# Patient Record
Sex: Female | Born: 1955 | Race: White | Hispanic: No | Marital: Married | Smoking: Never smoker
Health system: Southern US, Community
[De-identification: ages and names within clinical notes are randomized; demographics above are authoritative.]

## PROBLEM LIST (undated history)

## (undated) DIAGNOSIS — E0590 Thyrotoxicosis, unspecified without thyrotoxic crisis or storm: Secondary | ICD-10-CM

## (undated) DIAGNOSIS — K2100 Gastro-esophageal reflux disease with esophagitis, without bleeding: Secondary | ICD-10-CM

## (undated) DIAGNOSIS — K3189 Other diseases of stomach and duodenum: Secondary | ICD-10-CM

## (undated) DIAGNOSIS — E785 Hyperlipidemia, unspecified: Secondary | ICD-10-CM

## (undated) DIAGNOSIS — R51 Headache: Secondary | ICD-10-CM

## (undated) DIAGNOSIS — R519 Headache, unspecified: Secondary | ICD-10-CM

## (undated) DIAGNOSIS — K602 Anal fissure, unspecified: Secondary | ICD-10-CM

## (undated) DIAGNOSIS — Z86010 Personal history of colonic polyps: Principal | ICD-10-CM

## (undated) DIAGNOSIS — M8580 Other specified disorders of bone density and structure, unspecified site: Secondary | ICD-10-CM

## (undated) DIAGNOSIS — E669 Obesity, unspecified: Secondary | ICD-10-CM

## (undated) DIAGNOSIS — G8929 Other chronic pain: Secondary | ICD-10-CM

## (undated) DIAGNOSIS — K219 Gastro-esophageal reflux disease without esophagitis: Secondary | ICD-10-CM

## (undated) HISTORY — DX: Gastro-esophageal reflux disease with esophagitis: K21.0

## (undated) HISTORY — DX: Personal history of colonic polyps: Z86.010

## (undated) HISTORY — PX: ESOPHAGOGASTRODUODENOSCOPY: SHX1529

## (undated) HISTORY — DX: Gastro-esophageal reflux disease with esophagitis, without bleeding: K21.00

## (undated) HISTORY — DX: Other chronic pain: G89.29

## (undated) HISTORY — DX: Other diseases of stomach and duodenum: K31.89

## (undated) HISTORY — DX: Gastro-esophageal reflux disease without esophagitis: K21.9

## (undated) HISTORY — DX: Headache, unspecified: R51.9

## (undated) HISTORY — PX: COLONOSCOPY WITH PROPOFOL: SHX5780

## (undated) HISTORY — DX: Anal fissure, unspecified: K60.2

## (undated) HISTORY — PX: APPENDECTOMY: SHX54

## (undated) HISTORY — DX: Obesity, unspecified: E66.9

## (undated) HISTORY — DX: Other specified disorders of bone density and structure, unspecified site: M85.80

## (undated) HISTORY — DX: Headache: R51

## (undated) HISTORY — DX: Hyperlipidemia, unspecified: E78.5

## (undated) HISTORY — DX: Thyrotoxicosis, unspecified without thyrotoxic crisis or storm: E05.90

---

## 1991-02-27 HISTORY — PX: HEMORRHOID SURGERY: SHX153

## 1997-07-15 ENCOUNTER — Other Ambulatory Visit: Admission: RE | Admit: 1997-07-15 | Discharge: 1997-07-15 | Payer: Self-pay | Admitting: *Deleted

## 1998-06-13 ENCOUNTER — Other Ambulatory Visit: Admission: RE | Admit: 1998-06-13 | Discharge: 1998-06-13 | Payer: Self-pay | Admitting: Family Medicine

## 2000-02-22 ENCOUNTER — Other Ambulatory Visit: Admission: RE | Admit: 2000-02-22 | Discharge: 2000-02-22 | Payer: Self-pay | Admitting: Internal Medicine

## 2000-03-28 ENCOUNTER — Other Ambulatory Visit: Admission: RE | Admit: 2000-03-28 | Discharge: 2000-03-28 | Payer: Self-pay | Admitting: Obstetrics and Gynecology

## 2000-07-24 ENCOUNTER — Encounter: Admission: RE | Admit: 2000-07-24 | Discharge: 2000-07-24 | Payer: Self-pay | Admitting: *Deleted

## 2000-07-24 ENCOUNTER — Encounter: Payer: Self-pay | Admitting: Allergy and Immunology

## 2001-03-18 ENCOUNTER — Other Ambulatory Visit: Admission: RE | Admit: 2001-03-18 | Discharge: 2001-03-18 | Payer: Self-pay | Admitting: Obstetrics and Gynecology

## 2002-03-20 ENCOUNTER — Other Ambulatory Visit: Admission: RE | Admit: 2002-03-20 | Discharge: 2002-03-20 | Payer: Self-pay | Admitting: Obstetrics and Gynecology

## 2002-07-16 ENCOUNTER — Encounter: Admission: RE | Admit: 2002-07-16 | Discharge: 2002-07-16 | Payer: Self-pay | Admitting: Family Medicine

## 2002-07-16 ENCOUNTER — Encounter: Payer: Self-pay | Admitting: Family Medicine

## 2003-04-14 ENCOUNTER — Other Ambulatory Visit: Admission: RE | Admit: 2003-04-14 | Discharge: 2003-04-14 | Payer: Self-pay | Admitting: Obstetrics and Gynecology

## 2003-07-02 ENCOUNTER — Encounter: Admission: RE | Admit: 2003-07-02 | Discharge: 2003-07-02 | Payer: Self-pay | Admitting: Internal Medicine

## 2003-11-12 ENCOUNTER — Ambulatory Visit (HOSPITAL_COMMUNITY): Admission: RE | Admit: 2003-11-12 | Discharge: 2003-11-12 | Payer: Self-pay | Admitting: Internal Medicine

## 2004-06-19 ENCOUNTER — Encounter: Admission: RE | Admit: 2004-06-19 | Discharge: 2004-06-19 | Payer: Self-pay | Admitting: Internal Medicine

## 2004-06-23 ENCOUNTER — Encounter: Admission: RE | Admit: 2004-06-23 | Discharge: 2004-06-23 | Payer: Self-pay | Admitting: Internal Medicine

## 2004-11-20 ENCOUNTER — Encounter: Admission: RE | Admit: 2004-11-20 | Discharge: 2004-11-20 | Payer: Self-pay | Admitting: Internal Medicine

## 2004-11-28 ENCOUNTER — Encounter (HOSPITAL_COMMUNITY): Admission: RE | Admit: 2004-11-28 | Discharge: 2005-02-26 | Payer: Self-pay | Admitting: Internal Medicine

## 2005-01-29 ENCOUNTER — Encounter (INDEPENDENT_AMBULATORY_CARE_PROVIDER_SITE_OTHER): Payer: Self-pay | Admitting: *Deleted

## 2005-01-29 ENCOUNTER — Encounter: Admission: RE | Admit: 2005-01-29 | Discharge: 2005-01-29 | Payer: Self-pay | Admitting: Endocrinology

## 2005-01-29 ENCOUNTER — Other Ambulatory Visit: Admission: RE | Admit: 2005-01-29 | Discharge: 2005-01-29 | Payer: Self-pay | Admitting: Interventional Radiology

## 2005-02-13 ENCOUNTER — Encounter: Admission: RE | Admit: 2005-02-13 | Discharge: 2005-02-13 | Payer: Self-pay | Admitting: Internal Medicine

## 2006-01-04 ENCOUNTER — Encounter: Admission: RE | Admit: 2006-01-04 | Discharge: 2006-01-04 | Payer: Self-pay | Admitting: Gastroenterology

## 2006-07-05 ENCOUNTER — Other Ambulatory Visit: Admission: RE | Admit: 2006-07-05 | Discharge: 2006-07-05 | Payer: Self-pay | Admitting: *Deleted

## 2006-07-17 ENCOUNTER — Encounter: Admission: RE | Admit: 2006-07-17 | Discharge: 2006-07-17 | Payer: Self-pay | Admitting: *Deleted

## 2006-08-16 ENCOUNTER — Encounter (INDEPENDENT_AMBULATORY_CARE_PROVIDER_SITE_OTHER): Payer: Self-pay | Admitting: Obstetrics and Gynecology

## 2006-08-16 ENCOUNTER — Ambulatory Visit (HOSPITAL_COMMUNITY): Admission: RE | Admit: 2006-08-16 | Discharge: 2006-08-16 | Payer: Self-pay | Admitting: Obstetrics and Gynecology

## 2007-07-15 ENCOUNTER — Other Ambulatory Visit: Admission: RE | Admit: 2007-07-15 | Discharge: 2007-07-15 | Payer: Self-pay | Admitting: Family Medicine

## 2009-08-23 ENCOUNTER — Other Ambulatory Visit: Admission: RE | Admit: 2009-08-23 | Discharge: 2009-08-23 | Payer: Self-pay | Admitting: Radiology

## 2010-02-21 ENCOUNTER — Encounter
Admission: RE | Admit: 2010-02-21 | Discharge: 2010-02-21 | Payer: Self-pay | Source: Home / Self Care | Attending: Otolaryngology | Admitting: Otolaryngology

## 2010-03-18 ENCOUNTER — Encounter: Payer: Self-pay | Admitting: Endocrinology

## 2010-03-19 ENCOUNTER — Encounter: Payer: Self-pay | Admitting: Internal Medicine

## 2010-03-31 ENCOUNTER — Other Ambulatory Visit: Payer: Self-pay | Admitting: Dermatology

## 2010-07-11 NOTE — Op Note (Signed)
NAMEBRENLEY, Whitney Gonzales                ACCOUNT NO.:  [id number]   MEDICAL RECORD NO.:  [id number]          PATIENT TYPE:  AMB   LOCATION:  SDC                           FACILITY:  [facility]   PHYSICIAN:  Charles A. Delcambre, MDDATE OF BIRTH:  [birth year]   DATE OF PROCEDURE:  08/16/2006  DATE OF DISCHARGE:                               OPERATIVE REPORT   PREOPERATIVE DIAGNOSES:  1. Irregular bleeding.  2. Endometrial polyp.   POSTOPERATIVE DIAGNOSES:  1. Irregular bleeding.  2. Endometrial polyp.   PROCEDURE:  1. Hysteroscopy.  2. Dilation and curettage.  3. Polypectomy.  4. Paracervical block.   SURGEON:  Delcambre.   ASSISTANT:  None.   COMPLICATIONS:  None.   ESTIMATED BLOOD LOSS:  Less than 20 mL.   SPECIMENS:  1. Polyp.  2. Endometrial curettings to pathology.   ANESTHESIA:  General by the endotracheal route.   FINDINGS:  Small endometrial polyp on the anterior wall curetted off  with endometrial curettings.  Posterior wall was cystic appearing  grossly.  Polyp removed with polyp forceps.  Instrument, sponge, needle  count correct x2.   DESCRIPTION OF PROCEDURE:  The patient was taken to the operating room  and placed in supine position.  General anesthetic was induced without  difficulty.  She was then placed in the dorsal lithotomy position in  universal stirrups.  A sterile prep and drape was undertaken, a weighted  speculum was placed in vagina, tenaculum was placed on speculum,  paracervical block with 20 mL 0.25% plain Marcaine was divided equally  at 4 and 8 o'clock.  There was no evidence of intravascular location of  injection.  Hanks dilators were used to dilate enough to pass a small  hysteroscope.  A hysteroscope was passed.  Hysteroscopic findings were  noted.  The polyp forceps were used to remove the lower polyp.  Endometrial curettings were then undertaken circumferentially,  emphasizing the 2 o'clock area where the small sessile polyp had been  seen.   These are sent separately.  There was no evidence of perforation.  A camera was placed once again and all polyps had been removed.  There  was no active bleeding.  Tenaculum was  removed, no active bleeding.  The patient had labia that was stitched  up, and this was removed for visualization; the stitch was removed,  which was done during the case for visualization of the cervix.  Sorbitol loss was 0 during the case.      Charles A. Sydnee Cabal, MD  Electronically Signed     CAD/MEDQ  D:  08/16/2006  T:  08/16/2006  Job:  [id number]

## 2010-09-05 ENCOUNTER — Other Ambulatory Visit (HOSPITAL_COMMUNITY)
Admission: RE | Admit: 2010-09-05 | Discharge: 2010-09-05 | Disposition: A | Discharge status: Home / Self Care | Payer: BC Managed Care – PPO | Source: Ambulatory Visit | Attending: Family Medicine | Admitting: Family Medicine

## 2010-09-05 ENCOUNTER — Other Ambulatory Visit: Payer: Self-pay | Admitting: Physician Assistant

## 2010-09-05 DIAGNOSIS — Z01419 Encounter for gynecological examination (general) (routine) without abnormal findings: Secondary | ICD-10-CM | POA: Insufficient documentation

## 2010-12-13 LAB — CBC
HCT: 41.6
Hemoglobin: 14.2
MCHC: 34.1
MCV: 92.2
Platelets: 189
RBC: 4.51
RDW: 12.2
WBC: 5

## 2010-12-13 LAB — PREGNANCY, URINE: Preg Test, Ur: NEGATIVE

## 2012-04-23 ENCOUNTER — Other Ambulatory Visit: Payer: Self-pay | Admitting: Dermatology

## 2012-08-30 ENCOUNTER — Emergency Department (HOSPITAL_BASED_OUTPATIENT_CLINIC_OR_DEPARTMENT_OTHER)
Admission: EM | Admit: 2012-08-30 | Discharge: 2012-08-31 | Disposition: A | Discharge status: Home / Self Care | Payer: BC Managed Care – PPO | Attending: Emergency Medicine | Admitting: Emergency Medicine

## 2012-08-30 ENCOUNTER — Other Ambulatory Visit: Payer: Self-pay

## 2012-08-30 ENCOUNTER — Encounter (HOSPITAL_BASED_OUTPATIENT_CLINIC_OR_DEPARTMENT_OTHER): Payer: Self-pay | Admitting: *Deleted

## 2012-08-30 DIAGNOSIS — K219 Gastro-esophageal reflux disease without esophagitis: Secondary | ICD-10-CM

## 2012-08-30 DIAGNOSIS — R1013 Epigastric pain: Secondary | ICD-10-CM | POA: Insufficient documentation

## 2012-08-30 NOTE — ED Notes (Addendum)
Pt states she was walking down the steps this afternoon and developed some chest tightness that wouldn't go away. Had some SHOB and fatigue while shopping with daughter. "had to sit down" Also states she has been under a lot of stress for about a year "deadline coming up" EKG done at triage. Also states she has been bruising easily lately.

## 2012-08-31 ENCOUNTER — Emergency Department (HOSPITAL_BASED_OUTPATIENT_CLINIC_OR_DEPARTMENT_OTHER): Payer: BC Managed Care – PPO

## 2012-08-31 LAB — CBC WITH DIFFERENTIAL/PLATELET
Basophils Absolute: 0 10*3/uL (ref 0.0–0.1)
Basophils Relative: 0 % (ref 0–1)
Eosinophils Absolute: 0.1 10*3/uL (ref 0.0–0.7)
Eosinophils Relative: 2 % (ref 0–5)
HCT: 39.9 % (ref 36.0–46.0)
Hemoglobin: 14.1 g/dL (ref 12.0–15.0)
Lymphocytes Relative: 35 % (ref 12–46)
Lymphs Abs: 1.7 10*3/uL (ref 0.7–4.0)
MCH: 31.8 pg (ref 26.0–34.0)
MCHC: 35.3 g/dL (ref 30.0–36.0)
MCV: 89.9 fL (ref 78.0–100.0)
Monocytes Absolute: 0.6 10*3/uL (ref 0.1–1.0)
Monocytes Relative: 13 % — ABNORMAL HIGH (ref 3–12)
Neutro Abs: 2.5 10*3/uL (ref 1.7–7.7)
Neutrophils Relative %: 51 % (ref 43–77)
Platelets: 212 10*3/uL (ref 150–400)
RBC: 4.44 MIL/uL (ref 3.87–5.11)
RDW: 11.9 % (ref 11.5–15.5)
WBC: 4.8 10*3/uL (ref 4.0–10.5)

## 2012-08-31 LAB — COMPREHENSIVE METABOLIC PANEL
ALT: 14 U/L (ref 0–35)
AST: 21 U/L (ref 0–37)
Albumin: 3.9 g/dL (ref 3.5–5.2)
Alkaline Phosphatase: 78 U/L (ref 39–117)
BUN: 24 mg/dL — ABNORMAL HIGH (ref 6–23)
CO2: 29 mEq/L (ref 19–32)
Calcium: 9.4 mg/dL (ref 8.4–10.5)
Chloride: 102 mEq/L (ref 96–112)
Creatinine, Ser: 0.8 mg/dL (ref 0.50–1.10)
GFR calc Af Amer: >90 mL/min (ref >90)
GFR calc non Af Amer: 80 mL/min — ABNORMAL LOW (ref >90)
Glucose, Bld: 142 mg/dL — ABNORMAL HIGH (ref 70–99)
Potassium: 3.5 mEq/L (ref 3.5–5.1)
Sodium: 140 mEq/L (ref 135–145)
Total Bilirubin: 0.3 mg/dL (ref 0.3–1.2)
Total Protein: 6.9 g/dL (ref 6.0–8.3)

## 2012-08-31 LAB — LIPASE, BLOOD: Lipase: 44 U/L (ref 11–59)

## 2012-08-31 LAB — TROPONIN I
Troponin I: <0.3 ng/mL (ref <0.30)
Troponin I: <0.3 ng/mL (ref <0.30)

## 2012-08-31 MED ORDER — GI COCKTAIL ~~LOC~~
30.0000 mL | Freq: Once | ORAL | Status: AC
  Administered 2012-08-31: 30 mL via ORAL
  Filled 2012-08-31: qty 30

## 2012-08-31 MED ORDER — SUCRALFATE 1 GM/10ML PO SUSP: 1.0000 g | Freq: Four times a day (QID) | ORAL | Status: DC

## 2012-08-31 MED ORDER — OMEPRAZOLE 20 MG PO CPDR: 20.0000 mg | DELAYED_RELEASE_CAPSULE | Freq: Every day | ORAL | Status: DC

## 2012-08-31 NOTE — ED Notes (Signed)
MD at bedside. 

## 2012-08-31 NOTE — ED Provider Notes (Signed)
History  This chart was scribed for Marc Leichter Smitty Cords, MD by Greggory Stallion, ED Scribe. This patient was seen in room MH06/MH06 and the patient's care was started at 11:58 PM.  CSN: [id number] Arrival date & time 08/30/12  2318   Chief Complaint  Patient presents with  . Chest Pain   Patient is a 57 y.o. female presenting with chest pain. The history is provided by the patient. No language interpreter was used.  Chest Pain Pain location:  Epigastric Pain quality: tightness   Pain radiates to:  Does not radiate Pain radiates to the back: no   Pain severity:  Mild Onset quality:  Gradual Duration:  3 hours Timing:  Constant Progression:  Unchanged Chronicity:  New Context: eating and stress   Relieved by:  Nothing Worsened by:  Movement Ineffective treatments:  None tried Associated symptoms: no abdominal pain, no back pain, no claudication, no cough, no diaphoresis, no fever, no headache, no heartburn, no nausea, no near-syncope, no palpitations, no shortness of breath, no syncope, not vomiting and no weakness   Risk factors: no prior DVT/PE and no smoking     HPI Comments: AVANELLE PIXLEY is a 57 y.o. female who presents to the Emergency Department complaining of epigastric tightness that started when she was walking downstairs earlier tonight. . She states she was under stress related to a work project and had had a burrito earlier in the day and fried rice just prior to incident starting.  Pt denies wheezing. Pt states she has not taken any long car trips lately. LNMP was May 2013.  History reviewed. No pertinent past medical history. Past Surgical History  Procedure Laterality Date  . Appendectomy    . Hemorrhoid surgery     History reviewed. No pertinent family history. History  Substance Use Topics  . Smoking status: Never Smoker   . Smokeless tobacco: Not on file  . Alcohol Use: No   OB History   Grav Para Term Preterm Abortions TAB SAB Ect Mult Living                  Review of Systems  Constitutional: Negative for fever and diaphoresis.  Respiratory: Negative for cough, shortness of breath and wheezing.   Cardiovascular: Positive for chest pain. Negative for palpitations, claudication, leg swelling, syncope and near-syncope.  Gastrointestinal: Negative for heartburn, nausea, vomiting and abdominal pain.  Musculoskeletal: Negative for back pain.  Neurological: Negative for weakness and headaches.  All other systems reviewed and are negative.    Allergies  Aleve  Home Medications   Current Outpatient Rx  Name  Route  Sig  Dispense  Refill  . zolpidem (AMBIEN CR) 6.25 MG CR tablet   Oral   Take 6.25 mg by mouth at bedtime as needed for sleep.          BP 137/71  Pulse 68  Temp(Src) 98.1 F (36.7 C) (Oral)  Resp 20  Ht 5\' 6"  (1.676 m)  Wt 155 lb (70.308 kg)  BMI 25.03 kg/m2  SpO2 100%  Physical Exam  Nursing note and vitals reviewed. Constitutional: She is oriented to person, place, and time. She appears well-developed and well-nourished.  Non-toxic appearance. No distress.  HENT:  Head: Normocephalic and atraumatic.  Mouth/Throat: Oropharynx is clear and moist.  Moist mucous membranes, no exudates.   Eyes: Conjunctivae, EOM and lids are normal. Pupils are equal, round, and reactive to light.  Neck: Normal range of motion. Neck supple. No tracheal deviation present.  No mass present.  Cardiovascular: Normal rate, regular rhythm and normal heart sounds.  Exam reveals no gallop.   No murmur heard. Pulmonary/Chest: Effort normal and breath sounds normal. No stridor. No respiratory distress. She has no decreased breath sounds. She has no wheezes. She has no rhonchi. She has no rales.  Abdominal: Soft. Normal appearance. She exhibits no distension. Bowel sounds are increased. There is no tenderness. There is no rebound, no guarding, no CVA tenderness, no tenderness at McBurney's point and negative Murphy's sign.  A lot of gas in  epigastrium and LUQ.   Musculoskeletal: Normal range of motion. She exhibits no edema and no tenderness.  Neurological: She is alert and oriented to person, place, and time. She has normal strength. No cranial nerve deficit or sensory deficit. GCS eye subscore is 4. GCS verbal subscore is 5. GCS motor subscore is 6.  Skin: Skin is warm and dry. No abrasion and no rash noted.  Psychiatric: She has a normal mood and affect. Her speech is normal and behavior is normal.    ED Course  Procedures (including critical care time)  DIAGNOSTIC STUDIES: Oxygen Saturation is 100% on RA, normal by my interpretation.    COORDINATION OF CARE: 12:26 AM-Discussed treatment plan which includes chest xray with pt at bedside and pt agreed to plan.   Labs Reviewed - No data to display No results found. No diagnosis found.  MDM   Date: 08/31/2012  Rate: 67  Rhythm: normal sinus rhythm  QRS Axis: normal  Intervals: normal  ST/T Wave abnormalities: normal  Conduction Disutrbances: none  Narrative Interpretation: unremarkable  GERD brought on be stress and diet.  Symptoms relieved with GI cocktail and then returned as bitter acidic taste in throat and increased gas.  Doubt cardiac etiology.  Negative ekg and troponins follow up with your family doctor for ongoing care     I personally performed the services described in this documentation, which was scribed in my presence. The recorded information has been reviewed and is accurate.    Jasmine Awe, MD 08/31/12 [phone]

## 2012-08-31 NOTE — ED Notes (Signed)
Patient transported to X-ray via stretcher per tech. 

## 2013-04-29 ENCOUNTER — Other Ambulatory Visit: Payer: Self-pay | Admitting: Dermatology

## 2013-08-31 ENCOUNTER — Ambulatory Visit
Admission: RE | Admit: 2013-08-31 | Discharge: 2013-08-31 | Disposition: A | Discharge status: Home / Self Care | Payer: BC Managed Care – PPO | Source: Ambulatory Visit | Attending: Family | Admitting: Family

## 2013-08-31 ENCOUNTER — Other Ambulatory Visit: Payer: Self-pay | Admitting: Family

## 2013-08-31 DIAGNOSIS — G4482 Headache associated with sexual activity: Secondary | ICD-10-CM

## 2013-09-01 ENCOUNTER — Other Ambulatory Visit: Payer: Self-pay | Admitting: Family

## 2013-09-01 DIAGNOSIS — G4482 Headache associated with sexual activity: Secondary | ICD-10-CM

## 2013-09-04 ENCOUNTER — Encounter (INDEPENDENT_AMBULATORY_CARE_PROVIDER_SITE_OTHER): Payer: Self-pay

## 2013-09-04 ENCOUNTER — Ambulatory Visit
Admission: RE | Admit: 2013-09-04 | Discharge: 2013-09-04 | Disposition: A | Discharge status: Home / Self Care | Payer: BC Managed Care – PPO | Source: Ambulatory Visit | Attending: Family | Admitting: Family

## 2013-09-04 DIAGNOSIS — G4482 Headache associated with sexual activity: Secondary | ICD-10-CM

## 2013-09-04 MED ORDER — IOHEXOL 350 MG/ML SOLN
80.0000 mL | Freq: Once | INTRAVENOUS | Status: AC | PRN
  Administered 2013-09-04: 80 mL via INTRAVENOUS

## 2013-12-11 ENCOUNTER — Other Ambulatory Visit: Payer: Self-pay

## 2014-04-07 ENCOUNTER — Other Ambulatory Visit (HOSPITAL_COMMUNITY)
Admission: RE | Admit: 2014-04-07 | Discharge: 2014-04-07 | Disposition: A | Discharge status: Home / Self Care | Payer: 59 | Source: Ambulatory Visit | Attending: Family Medicine | Admitting: Family Medicine

## 2014-04-07 ENCOUNTER — Other Ambulatory Visit: Payer: Self-pay

## 2014-04-07 DIAGNOSIS — Z01419 Encounter for gynecological examination (general) (routine) without abnormal findings: Secondary | ICD-10-CM | POA: Insufficient documentation

## 2014-04-09 LAB — CYTOLOGY - PAP

## 2015-06-22 ENCOUNTER — Encounter: Payer: Self-pay | Admitting: Gastroenterology

## 2015-06-22 ENCOUNTER — Ambulatory Visit (INDEPENDENT_AMBULATORY_CARE_PROVIDER_SITE_OTHER): Payer: 59 | Admitting: Gastroenterology

## 2015-06-22 VITALS — BP 112/64 | HR 76 | Ht 65.25 in | Wt 175.1 lb

## 2015-06-22 DIAGNOSIS — K31A0 Gastric intestinal metaplasia, unspecified: Secondary | ICD-10-CM | POA: Insufficient documentation

## 2015-06-22 DIAGNOSIS — Z1211 Encounter for screening for malignant neoplasm of colon: Secondary | ICD-10-CM

## 2015-06-22 DIAGNOSIS — K219 Gastro-esophageal reflux disease without esophagitis: Secondary | ICD-10-CM

## 2015-06-22 DIAGNOSIS — J029 Acute pharyngitis, unspecified: Secondary | ICD-10-CM

## 2015-06-22 DIAGNOSIS — K3189 Other diseases of stomach and duodenum: Secondary | ICD-10-CM | POA: Insufficient documentation

## 2015-06-22 HISTORY — DX: Gastric intestinal metaplasia, unspecified: K31.A0

## 2015-06-22 NOTE — Patient Instructions (Signed)

## 2015-06-22 NOTE — Progress Notes (Signed)
06/22/2015 Whitney Gonzales [id number] 1955-03-11   HISTORY OF PRESENT ILLNESS:  This is a pleasant 60 year old female who is new to our practice.  She is here today to discuss colonoscopy. She tells me that her last colonoscopy was 10 years ago by Dr. Earlean Shawl at which time the study was normal and it was recommended that she have a repeat procedure in 10 years from that time. While she is here she also mentions that she has a constant degree of sore throat. She does have intermittent acid reflux, maybe 1 episode per week, but does not take anything for those issues. She is unsure if the sore throat is related to the reflux or if it is something totally separate. She reports having an endoscopy the same time as her last colonoscopy as well; says that she had a small hiatal hernia. She is reluctant to take any reflux medication, particularly on a regular basis.  **Requesting Dr. Carlean Purl.   Past Medical History  Diagnosis Date  . Anal fissure   . Chronic headaches   . HLD (hyperlipidemia)   . Hyperthyroidism    Past Surgical History  Procedure Laterality Date  . Appendectomy    . Hemorrhoid surgery  1993    reports that she has never smoked. She has never used smokeless tobacco. She reports that she drinks alcohol. She reports that she does not use illicit drugs. family history includes Cancer in her mother; Diabetes in her maternal grandfather, maternal grandmother, paternal grandfather, and paternal grandmother; Heart disease in her maternal grandfather; Liver cancer in her paternal aunt. Allergies  Allergen Reactions  . Aleve [Naproxen Sodium] Itching      Outpatient Encounter Prescriptions as of 06/22/2015  Medication Sig  . ergocalciferol (VITAMIN D2) 50000 units capsule Take 50,000 Units by mouth once a week.  . zolpidem (AMBIEN CR) 6.25 MG CR tablet Take 6.25 mg by mouth at bedtime as needed for sleep.  . [DISCONTINUED] omeprazole (PRILOSEC) 20 MG capsule Take 1 capsule (20 mg  total) by mouth daily.  . [DISCONTINUED] sucralfate (CARAFATE) 1 GM/10ML suspension Take 10 mLs (1 g total) by mouth 4 (four) times daily.   No facility-administered encounter medications on file as of 06/22/2015.     REVIEW OF SYSTEMS  : All other systems reviewed and negative except where noted in the History of Present Illness.   PHYSICAL EXAM: BP 112/64 mmHg  Pulse 76  Ht 5' 5.25" (1.657 m)  Wt 175 lb 2 oz (79.436 kg)  BMI 28.93 kg/m2 General: Well developed white female in no acute distress Head: Normocephalic and atraumatic Eyes:  Sclerae anicteric, conjunctiva pink. Ears: Normal auditory acuity Lungs: Clear throughout to auscultation Heart: Regular rate and rhythm Abdomen: Soft, non-distended.  Normal bowel sounds.  Non-tender. Rectal:  Will be done at the time of colonoscopy. Musculoskeletal: Symmetrical with no gross deformities  Skin: No lesions on visible extremities Extremities: No edema  Neurological: Alert oriented x 4, grossly non-focal Psychological:  Alert and cooperative. Normal mood and affect  ASSESSMENT AND PLAN: -Screening colonoscopy:  Her last was 10 years ago with Dr. Earlean Shawl.  Will schedule with Dr. Carlean Purl (patient request; she is friends with Margaretmary Eddy on the 4th floor). -Sore throat:  She does have some heartburn/reflux maybe once a week or so.  Does not take any medication and is reluctant to do so especially on regular basis. I am unsure if her sore throat is related to reflux or not. Will assess with  EGD, but explained her need to possibly take daily medication even if it is just an H2 blocker if she has evidence of severe reflux versus possibly seeing ENT physician for her sore throat if endoscopy is unremarkable.  *The risks, benefits, and alternatives to EGD and colonoscopy were discussed with the patient and she consents to proceed.       CC:  No ref. provider found

## 2015-07-07 ENCOUNTER — Encounter: Payer: Self-pay | Admitting: Internal Medicine

## 2015-07-07 ENCOUNTER — Ambulatory Visit (AMBULATORY_SURGERY_CENTER): Payer: 59 | Admitting: Internal Medicine

## 2015-07-07 VITALS — BP 130/68 | HR 65 | Temp 99.1°F | Resp 15 | Ht 65.25 in | Wt 175.0 lb

## 2015-07-07 DIAGNOSIS — K317 Polyp of stomach and duodenum: Secondary | ICD-10-CM | POA: Diagnosis not present

## 2015-07-07 DIAGNOSIS — K2100 Gastro-esophageal reflux disease with esophagitis, without bleeding: Secondary | ICD-10-CM

## 2015-07-07 DIAGNOSIS — D122 Benign neoplasm of ascending colon: Secondary | ICD-10-CM

## 2015-07-07 DIAGNOSIS — K208 Other esophagitis: Secondary | ICD-10-CM | POA: Diagnosis not present

## 2015-07-07 DIAGNOSIS — Z1211 Encounter for screening for malignant neoplasm of colon: Secondary | ICD-10-CM | POA: Diagnosis not present

## 2015-07-07 MED ORDER — PANTOPRAZOLE SODIUM 40 MG PO TBEC: 40.0000 mg | DELAYED_RELEASE_TABLET | Freq: Every day | ORAL | Status: DC

## 2015-07-07 MED ORDER — SODIUM CHLORIDE 0.9 % IV SOLN: 500.0000 mL | INTRAVENOUS | Status: DC

## 2015-07-07 NOTE — Progress Notes (Signed)
Called to room to assist during endoscopic procedure.  Patient ID and intended procedure confirmed with present staff. Received instructions for my participation in the procedure from the performing physician.  

## 2015-07-07 NOTE — Progress Notes (Signed)
A and Ox 3 Report to RN 

## 2015-07-07 NOTE — Op Note (Addendum)
[facility] Patient Name: Whitney Gonzales Procedure Date: 07/07/2015 1:50 PM MRN: [id number] Endoscopist: Gatha Mayer , MD Age: 60 Date of Birth: [birth year] Gender: Female Procedure:                Upper GI endoscopy Indications:              Suspected esophageal reflux, Sore throat Medicines:                Propofol per Anesthesia, Monitored Anesthesia Care Procedure:                Pre-Anesthesia Assessment:                           - Prior to the procedure, a History and Physical                            was performed, and patient medications and                            allergies were reviewed. The patient's tolerance of                            previous anesthesia was also reviewed. The risks                            and benefits of the procedure and the sedation                            options and risks were discussed with the patient.                            All questions were answered, and informed consent                            was obtained. Prior Anticoagulants: The patient has                            taken no previous anticoagulant or antiplatelet                            agents. ASA Grade Assessment: II - A patient with                            mild systemic disease. After reviewing the risks                            and benefits, the patient was deemed in                            satisfactory condition to undergo the procedure.                           After obtaining informed consent, the endoscope was  passed under direct vision. Throughout the                            procedure, the patient's blood pressure, pulse, and                            oxygen saturations were monitored continuously. The                            Model GIF-[id number]) scope was introduced                            through the mouth, and advanced to the second part                            of duodenum. The upper GI  endoscopy was                            accomplished without difficulty. The patient                            tolerated the procedure well. Scope In: Scope Out: Findings:                 The hypopharynx was normal.                           The larynx was normal. Limited views so lesions                            could have been missed                           LA Grade A (one or more mucosal breaks less than 5                            mm, not extending between tops of 2 mucosal folds)                            esophagitis with no bleeding was found at the                            gastroesophageal junction. Biopsies were taken with                            a cold forceps for histology. Verification of                            patient identification for the specimen was done.                            Estimated blood loss was minimal.                           LA Grade A (  one or more mucosal breaks less than 5                            mm, not extending between tops of 2 mucosal folds)                            esophagitis with no bleeding was found in the                            middle third of the esophagus. Biopsies were taken                            with a cold forceps for histology. Verification of                            patient identification for the specimen was done.                            Estimated blood loss was minimal.                           A small hiatal hernia was present.                           A single 6 mm sessile polyp with no bleeding and no                            stigmata of recent bleeding was found on the                            greater curvature of the gastric body. The polyp                            was removed with a cold biopsy forceps. Resection                            and retrieval were complete. Verification of                            patient identification for the specimen was done.                             Estimated blood loss was minimal.                           The exam was otherwise without abnormality.                           Gastric retroflexion performed. Complications:            No immediate complications. Estimated Blood Loss:     Estimated blood loss was minimal. Impression:               - Normal hypopharynx.                           -  Normal larynx.                           - LA Grade A reflux esophagitis. Biopsied.                           - LA Grade A reflux esophagitis. Biopsied.                           - Small hiatal hernia.                           - A single gastric polyp. Resected and retrieved.                           - The examination was otherwise normal. Recommendation:           - Patient has a contact number available for                            emergencies. The signs and symptoms of potential                            delayed complications were discussed with the                            patient. Return to normal activities tomorrow.                            Written discharge instructions were provided to the                            patient.                           - Resume previous diet.                           - Continue present medications.                           - Await pathology results.                           - See the other procedure note for documentation of                            additional recommendations. Gatha Mayer, MD 07/07/2015 2:34:50 PM This report has been signed electronically. Addendum Number: 1   Addendum Date: 07/07/2015 2:38:50 PM      Will start pantoprazole 40 mg q AM Gatha Mayer, MD 07/07/2015 2:39:37 PM This report has been signed electronically.

## 2015-07-07 NOTE — Patient Instructions (Addendum)
You do have signs of acid reflux inflammation in the esophagus. I recommend you take pantoprazole daily and see me in a few months. We can discuss long-term treatment then.  I also found and removed one stomach polyp - usually not of consequence.  Did not get a great look at vocal cords but what I saw was ok.  Also had a tiny colon polyp - removed.  I will let you know pathology results and when to have another routine colonoscopy by mail.  I appreciate the opportunity to care for you. Gatha Mayer, MD, FACG   YOU HAD AN ENDOSCOPIC PROCEDURE TODAY AT [facility]:   Refer to the procedure report that was given to you for any specific questions about what was found during the examination.  If the procedure report does not answer your questions, please call your gastroenterologist to clarify.  If you requested that your care partner not be given the details of your procedure findings, then the procedure report has been included in a sealed envelope for you to review at your convenience later.  YOU SHOULD EXPECT: Some feelings of bloating in the abdomen. Passage of more gas than usual.  Walking can help get rid of the air that was put into your GI tract during the procedure and reduce the bloating. If you had a lower endoscopy (such as a colonoscopy or flexible sigmoidoscopy) you may notice spotting of blood in your stool or on the toilet paper. If you underwent a bowel prep for your procedure, you may not have a normal bowel movement for a few days.  Please Note:  You might notice some irritation and congestion in your nose or some drainage.  This is from the oxygen used during your procedure.  There is no need for concern and it should clear up in a day or so.  SYMPTOMS TO REPORT IMMEDIATELY:   Following lower endoscopy (colonoscopy or flexible sigmoidoscopy):  Excessive amounts of blood in the stool  Significant tenderness or worsening of abdominal  pains  Swelling of the abdomen that is new, acute  Fever of 100F or higher   Following upper endoscopy (EGD)  Vomiting of blood or coffee ground material  New chest pain or pain under the shoulder blades  Painful or persistently difficult swallowing  New shortness of breath  Fever of 100F or higher  Black, tarry-looking stools  For urgent or emergent issues, a gastroenterologist can be reached at any hour by calling [phone].   DIET: Your first meal following the procedure should be a small meal and then it is ok to progress to your normal diet. Heavy or fried foods are harder to digest and may make you feel nauseous or bloated.  Likewise, meals heavy in dairy and vegetables can increase bloating.  Drink plenty of fluids but you should avoid alcoholic beverages for 24 hours.  ACTIVITY:  You should plan to take it easy for the rest of today and you should NOT DRIVE or use heavy machinery until tomorrow (because of the sedation medicines used during the test).    FOLLOW UP: Our staff will call the number listed on your records the next business day following your procedure to check on you and address any questions or concerns that you may have regarding the information given to you following your procedure. If we do not reach you, we will leave a message.  However, if you are feeling well and you are not experiencing any  problems, there is no need to return our call.  We will assume that you have returned to your regular daily activities without incident.  If any biopsies were taken you will be contacted by phone or by letter within the next 1-3 weeks.  Please call us at [phone] if you have not heard about the biopsies in 3 weeks.    SIGNATURES/CONFIDENTIALITY: You and/or your care partner have signed paperwork which will be entered into your electronic medical record.  These signatures attest to the fact that that the information above on your After Visit Summary has been  reviewed and is understood.  Full responsibility of the confidentiality of this discharge information lies with you and/or your care-partner.  Please review esophagitis, hiatal hernia, and polyp handouts provided.

## 2015-07-07 NOTE — Op Note (Signed)
[place] Patient Name: Whitney Gonzales Procedure Date: 07/07/2015 1:49 PM MRN: [id number] Endoscopist: Gatha Mayer , MD Age: 60 Date of Birth: [birth year] Gender: Female Procedure:                Colonoscopy Indications:              Screening for colorectal malignant neoplasm (last                            colonoscopy was 10 years ago) Medicines:                Propofol per Anesthesia, Monitored Anesthesia Care Procedure:                Pre-Anesthesia Assessment:                           - Prior to the procedure, a History and Physical                            was performed, and patient medications and                            allergies were reviewed. The patient's tolerance of                            previous anesthesia was also reviewed. The risks                            and benefits of the procedure and the sedation                            options and risks were discussed with the patient.                            All questions were answered, and informed consent                            was obtained. Prior Anticoagulants: The patient has                            taken no previous anticoagulant or antiplatelet                            agents. ASA Grade Assessment: II - A patient with                            mild systemic disease. After reviewing the risks                            and benefits, the patient was deemed in                            satisfactory condition to undergo the procedure.  After obtaining informed consent, the colonoscope                            was passed under direct vision. Throughout the                            procedure, the patient's blood pressure, pulse, and                            oxygen saturations were monitored continuously. The                            Model CF-HQ190L [id number]) scope was introduced                            through the anus and advanced to the the  cecum,                            identified by appendiceal orifice and ileocecal                            valve. The colonoscopy was performed without                            difficulty. The patient tolerated the procedure                            well. The quality of the bowel preparation was                            excellent. The bowel preparation used was Miralax.                            The ileocecal valve, appendiceal orifice, and                            rectum were photographed. Scope In: 2:08:44 PM Scope Out: 2:19:37 PM Scope Withdrawal Time: 0 hours 8 minutes 47 seconds  Total Procedure Duration: 0 hours 10 minutes 53 seconds  Findings:                 The perianal and digital rectal examinations were                            normal.                           A 5 mm polyp was found in the ascending colon. The                            polyp was sessile. The polyp was removed with a                            cold snare. Resection and retrieval were complete.  Verification of patient identification for the                            specimen was done. Estimated blood loss was minimal.                           The exam was otherwise without abnormality on                            direct and retroflexion views. Complications:            No immediate complications. Estimated Blood Loss:     Estimated blood loss was minimal. Impression:               - One 5 mm polyp in the ascending colon, removed                            with a cold snare. Resected and retrieved.                           - The examination was otherwise normal on direct                            and retroflexion views. Recommendation:           - Patient has a contact number available for                            emergencies. The signs and symptoms of potential                            delayed complications were discussed with the                             patient. Return to normal activities tomorrow.                            Written discharge instructions were provided to the                            patient.                           - Resume previous diet.                           - Continue present medications.                           - Repeat colonoscopy is recommended for                            surveillance. The colonoscopy date will be                            determined after pathology results from today's  exam become available for review. Gatha Mayer, MD 07/07/2015 2:38:25 PM This report has been signed electronically.

## 2015-07-08 ENCOUNTER — Telehealth: Payer: Self-pay | Admitting: *Deleted

## 2015-07-08 NOTE — Telephone Encounter (Signed)
  Follow up Call-  Call back number 07/07/2015  Post procedure Call Back phone  # [phone]  Permission to leave phone message Yes     Patient questions:  Do you have a fever, pain , or abdominal swelling? No. Pain Score  0 *  Have you tolerated food without any problems? Yes.    Have you been able to return to your normal activities? Yes.    Do you have any questions about your discharge instructions: Diet   No. Medications  No. Follow up visit  No.  Do you have questions or concerns about your Care? No.  Actions: * If pain score is 4 or above: No action needed, pain <4.

## 2015-07-10 NOTE — Progress Notes (Signed)
Agree with Ms. Zehr's management.  Carl E. Gessner, MD, FACG  

## 2015-07-11 DIAGNOSIS — Z86010 Personal history of colonic polyps: Secondary | ICD-10-CM

## 2015-07-11 DIAGNOSIS — Z860101 Personal history of adenomatous and serrated colon polyps: Secondary | ICD-10-CM

## 2015-07-11 HISTORY — DX: Personal history of adenomatous and serrated colon polyps: Z86.0101

## 2015-07-11 HISTORY — DX: Personal history of colonic polyps: Z86.010

## 2015-07-15 ENCOUNTER — Encounter: Payer: Self-pay | Admitting: Internal Medicine

## 2015-07-15 DIAGNOSIS — Z86010 Personal history of colonic polyps: Secondary | ICD-10-CM

## 2015-07-15 NOTE — Progress Notes (Signed)
Quick Note:  EGD - GE junction bx intestinal metaplasia , hyperplastic gastric polyp, nl mid esophagus Colon 5 mm adenoma  Recall colon 2022 OV summer She probably does NOT have Barrett's given size of changes  ______

## 2015-09-19 ENCOUNTER — Encounter: Payer: Self-pay | Admitting: Dietician

## 2015-09-19 ENCOUNTER — Encounter: Payer: 59 | Attending: Family | Admitting: Dietician

## 2015-09-19 DIAGNOSIS — M8580 Other specified disorders of bone density and structure, unspecified site: Secondary | ICD-10-CM | POA: Insufficient documentation

## 2015-09-19 DIAGNOSIS — E669 Obesity, unspecified: Secondary | ICD-10-CM

## 2015-09-19 NOTE — Progress Notes (Signed)
Medical Nutrition Therapy:  Appt start time: 1600 end time:  1700.   Assessment:  Primary concerns today: Patient is here alone.  She would like to learn how to decrease inflammation with diet, increase nutrient density and lose weight and discuss her food allergies/sensitivities.  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  She gets hives with shrimp, is allergic to tomatoes, and corn on the cob and gluten seems to cause glutin.  She states that she is also quite sensitive and had a reaction to soy sauce at one time.  She had a recent endoscopy and colonoscopy which showed inflamation and she has been on a PPI since.  Negative test for celiac in the past.  Recent bone density showed osteopenia.  She is working with a sleep doctor currently to come off Ambien.  She started having sleeping problems years ago as a result of hyperthyroidism.  She gets 8 1/2 hours of interrupted sleep due to bathroom and other waking then has problems returning to sleep.  Weight hx: Lowest adult weight:  128 lbs at age 12 Highest adult weight:  175 lbs (today's weight) She eats differently with depression (increased chocolate) which increases weight gain. Preferred weight 135 lbs.  Lost to this following low carb diet then regained with resumption of other eating habits and start of menopause. She has tried low calorie, low fat in the past but this seemed too restrictive and felt more satisfied on the [employer].  Patient lives with husband and daughter.  He does most of the cooking and shopping.  She works for [employer] a company that makes [employer] from 10-7. Her husband has had colon cancer and surgery for this recently.    Preferred Learning Style:   No preference indicated   Learning Readiness:   Ready  Change in progress   MEDICATIONS: see list   DIETARY INTAKE: She has been reducing the meal portion size since endoscopy.  24-hr recall:  B ( AM):  Adkins meal replacement bar or eggs and bacon on weekends Snk ( AM): none  L ( PM): salad bar (lettuce, cucumbers, egg, cheese, bacon, craisins, ranch) OR beef stroganoff from [employer] OR 1/2 Conservation officer, historic buildings and Insurance risk surveyor fries Snk ( PM): occasional Kind protein bar D ( PM): steak soft taco, chips and queso OR 6 inch sub  Snk ( PM): craves something sweet after dinner often and occasionally has a small bowl of gelato, ice cream or chocolate Beverages: water, occasional regular root beer, decaf hot tea  Usual physical activity: Walks at night started recently 2-3 x per week for 30 minutes, yoga but problems with time  Estimated energy needs: 1400 calories 158 g carbohydrates 105 g protein 39 g fat  Progress Towards Goal(s):  In progress.   Nutritional Diagnosis:  NB-1.1 Food and nutrition-related knowledge deficit As related to general nutrition.  As evidenced by patient report.    Intervention:  Nutrition counseling/education related to prevention of GERD, tips to decrease inflammation, mindful eating.  Discussed importance of increasing physical activity and continuing healthy diet.  Discussed journaling and option for other allergy testing.  Discussed benefits of increasing vegetable intake.  Maintain an active lifestyle.  Stay active most days of the week. Eat slowly and stop when you are satisfied. Avoid known allergins and foods that you are sensitive to. See the Reflux handout for other foods that you may be sensitive to. Avoid fried foods and foods  with a lot of added fat as these can cause more systems. Increase your intake of plant based foods especially non starchy vegetables. 3 servings per day of dairy or calcium containing foods. Adequate sunlight, milk intake, or vitamin D supplementation.  Consider supplement in the winter.   Consider Omega 3, curcumin (tumeric) supplements that can decrease inflammation. Increased plant and decreased animal foods can decrease  inflammation.  Teaching Method Utilized:  Visual Auditory Hands on  Handouts given during visit include:  GERD handout from [facility]  Meal plan card  Barriers to learning/adherence to lifestyle change: none  Demonstrated degree of understanding via:  Teach Back   Monitoring/Evaluation:  Dietary intake, exercise, label reading, and body weight prn.

## 2015-09-19 NOTE — Patient Instructions (Signed)
Maintain an active lifestyle.  Stay active most days of the week. Eat slowly and stop when you are satisfied. Avoid known allergins and foods that you are sensitive to. See the Reflux handout for other foods that you may be sensitive to. Avoid fried foods and foods with a lot of added fat as these can cause more systems. Increase your intake of plant based foods especially non starchy vegetables. 3 servings per day of dairy or calcium containing foods. Adequate sunlight, milk intake, or vitamin D supplementation.  Consider supplement in the winter.   Consider Omega 3, curcumin (tumeric) supplements that can decrease inflammation. Increased plant and decreased animal foods can decrease inflammation.

## 2015-10-05 ENCOUNTER — Other Ambulatory Visit: Payer: Self-pay | Admitting: Internal Medicine

## 2015-10-21 ENCOUNTER — Encounter: Payer: Self-pay | Admitting: Internal Medicine

## 2015-10-21 ENCOUNTER — Ambulatory Visit (INDEPENDENT_AMBULATORY_CARE_PROVIDER_SITE_OTHER): Payer: 59 | Admitting: Internal Medicine

## 2015-10-21 VITALS — BP 106/62 | HR 74 | Ht 66.0 in | Wt 174.1 lb

## 2015-10-21 DIAGNOSIS — J387 Other diseases of larynx: Secondary | ICD-10-CM

## 2015-10-21 DIAGNOSIS — K2100 Gastro-esophageal reflux disease with esophagitis, without bleeding: Secondary | ICD-10-CM

## 2015-10-21 DIAGNOSIS — K3189 Other diseases of stomach and duodenum: Secondary | ICD-10-CM

## 2015-10-21 DIAGNOSIS — K31A0 Gastric intestinal metaplasia, unspecified: Secondary | ICD-10-CM

## 2015-10-21 DIAGNOSIS — K219 Gastro-esophageal reflux disease without esophagitis: Secondary | ICD-10-CM

## 2015-10-21 MED ORDER — PANTOPRAZOLE SODIUM 40 MG PO TBEC: DELAYED_RELEASE_TABLET | ORAL | 1 refills | Status: DC

## 2015-10-21 NOTE — Progress Notes (Signed)
   Subjective:    Patient ID: Whitney Gonzales, female    DOB: [birth year], 60 y.o.   MRN: [id number] Cc: f/u reflux HPI 50-60% better re: hoarseness and sore throat. Taking pantoprazole 40 mg qd. asking about food sensitivity testing - blood tests. 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  Minimal caffeine  EGD in May demonstrated inflammatory changes at the GE junction and biopsy showed intestinal metaplasia though there was not more than 1 cm change. GI per plastic gastric polyp. Medications, allergies, past medical history, past surgical history, family history and social history are reviewed and updated in the EMR.  Review of Systems As above    Objective:   Physical Exam BP 106/62   Pulse 74   Ht 5\' 6"  (1.676 m)   Wt 174 lb 2 oz (79 kg)   BMI 28.10 kg/m  No acute distress     Assessment & Plan:   Encounter Diagnoses  Name Primary?  . Reflux esophagitis Yes  . Laryngopharyngeal reflux (LPR)   . Intestinal metaplasia of GE junction    RTC 4 mos If at =2 mos not sig better will do bid PPI  15 minutes time spent with patient > half in counseling coordination of care  I appreciate the opportunity to care for this patient. CC: Osa Craver, MD

## 2015-10-21 NOTE — Patient Instructions (Addendum)
  Today we are giving you a handout to read on food allergies.    We have sent the following medications to your pharmacy for you to pick up at your convenience: Pantoprazole   Follow up with Dr Carlean Purl in 4 months.      I appreciate the opportunity to care for you. Silvano Rusk, MD, [facility]

## 2015-10-24 ENCOUNTER — Encounter: Payer: Self-pay | Admitting: Internal Medicine

## 2016-02-06 ENCOUNTER — Encounter (HOSPITAL_BASED_OUTPATIENT_CLINIC_OR_DEPARTMENT_OTHER): Payer: Self-pay

## 2016-02-06 ENCOUNTER — Emergency Department (HOSPITAL_BASED_OUTPATIENT_CLINIC_OR_DEPARTMENT_OTHER)
Admission: EM | Admit: 2016-02-06 | Discharge: 2016-02-06 | Disposition: A | Discharge status: Home / Self Care | Payer: 59 | Attending: Emergency Medicine | Admitting: Emergency Medicine

## 2016-02-06 DIAGNOSIS — R197 Diarrhea, unspecified: Secondary | ICD-10-CM | POA: Diagnosis not present

## 2016-02-06 DIAGNOSIS — R002 Palpitations: Secondary | ICD-10-CM | POA: Insufficient documentation

## 2016-02-06 DIAGNOSIS — R200 Anesthesia of skin: Secondary | ICD-10-CM | POA: Diagnosis not present

## 2016-02-06 LAB — CBC WITH DIFFERENTIAL/PLATELET
Basophils Absolute: 0 10*3/uL (ref 0.0–0.1)
Basophils Relative: 0 %
Eosinophils Absolute: 0.1 10*3/uL (ref 0.0–0.7)
Eosinophils Relative: 1 %
HCT: 39.7 % (ref 36.0–46.0)
Hemoglobin: 13.6 g/dL (ref 12.0–15.0)
Lymphocytes Relative: 32 %
Lymphs Abs: 2.1 10*3/uL (ref 0.7–4.0)
MCH: 31.1 pg (ref 26.0–34.0)
MCHC: 34.3 g/dL (ref 30.0–36.0)
MCV: 90.8 fL (ref 78.0–100.0)
Monocytes Absolute: 0.7 10*3/uL (ref 0.1–1.0)
Monocytes Relative: 10 %
Neutro Abs: 3.7 10*3/uL (ref 1.7–7.7)
Neutrophils Relative %: 57 %
Platelets: 232 10*3/uL (ref 150–400)
RBC: 4.37 MIL/uL (ref 3.87–5.11)
RDW: 12.3 % (ref 11.5–15.5)
WBC: 6.6 10*3/uL (ref 4.0–10.5)

## 2016-02-06 LAB — BASIC METABOLIC PANEL
Anion gap: 9 (ref 5–15)
BUN: 13 mg/dL (ref 6–20)
CO2: 26 mmol/L (ref 22–32)
Calcium: 8.5 mg/dL — ABNORMAL LOW (ref 8.9–10.3)
Chloride: 104 mmol/L (ref 101–111)
Creatinine, Ser: 0.82 mg/dL (ref 0.44–1.00)
GFR calc Af Amer: >60 mL/min (ref >60)
GFR calc non Af Amer: >60 mL/min (ref >60)
Glucose, Bld: 103 mg/dL — ABNORMAL HIGH (ref 65–99)
Potassium: 3.6 mmol/L (ref 3.5–5.1)
Sodium: 139 mmol/L (ref 135–145)

## 2016-02-06 LAB — TROPONIN I: Troponin I: <0.03 ng/mL (ref <0.03)

## 2016-02-06 NOTE — ED Provider Notes (Signed)
[facility] DEPT [facility] Provider Note   CSN: [id number] Arrival date & time: 02/06/16  1818   By signing my name below, I, Neta Mends, attest that this documentation has been prepared under the direction and in the presence of Orlie Dakin, MD . Electronically Signed: Neta Mends, ED Scribe. 02/06/2016. 6:54 PM.   History   Chief Complaint Chief Complaint  Patient presents with  . Palpitations    The history is provided by the patient. No language interpreter was used.   HPI Comments:  Whitney Gonzales is a 60 y.o. female with PMHx of HLD and GERD who presents to the Emergency Department complaining of gradually improving heart palpitations that began yesterday. Pt complains of associated diarrhea x 4 days.Since being on antibiotics for sinus infection. Pt states that she feels like her heart is racing, but it felt worse yesterday. This afternoon she developed numbness and tingling in her left face and left arm which is still present. She denies weakness or difficulty speaking. She denies chest pain. No other associated symptoms. Nothing makes symptoms better or worse. No treatment prior to coming here Per triage note, pt has been under a lot of stress lately. Family reports that pt's mother died last year. Pt reports that she had an adverse reaction to Augmentin recently. Pt does not smoke, drink alcohol, or drink caffeine. Pt notes a known allergy to Aleve. No modifying factors noted. Pt denies chest pain, SOB.    Past Medical History:  Diagnosis Date  . Anal fissure   . Chronic headaches   . HLD (hyperlipidemia)   . Hx of adenomatous polyp of colon 07/11/2015  . Hyperthyroidism   . 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 Osteopenia     Patient Active Problem List   Diagnosis Date Noted  . Hx of adenomatous polyp of colon 07/11/2015  . Sore throat 06/22/2015  . 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 APPENDECTOMY    . COLONOSCOPY WITH PROPOFOL    . ESOPHAGOGASTRODUODENOSCOPY    . [place]    OB History    No data available       Home Medications    Prior to Admission medications   Medication Sig Start Date End Date Taking? Authorizing Provider  CEFDINIR PO Take by mouth.   Yes Historical Provider, MD  acetaminophen (TYLENOL) 500 MG tablet Take 500 mg by mouth every 6 (six) hours as needed.    Historical Provider, MD  pantoprazole (PROTONIX) 40 MG tablet TAKE 1 TABLET BY MOUTH DAILY BEFORE BREAKFAST. 10/21/15   Gatha Mayer, MD  zolpidem (AMBIEN CR) 6.25 MG CR tablet Take 6.25 mg by mouth at bedtime as needed for sleep.    Historical Provider, MD    Family History Family History  Problem Relation Age of Onset  . Cancer Mother     laryngeal  . Liver cancer Paternal Aunt   . Diabetes Maternal Grandfather   . Heart disease Maternal Grandfather   . Diabetes Maternal Grandmother   . Diabetes Paternal Grandfather   . Diabetes Paternal Grandmother     Social History Social History  Substance Use Topics  . Smoking status: Never Smoker  . Smokeless tobacco: Never Used  . Alcohol use 0.0 oz/week     Comment: occ     Allergies   Chicken allergy; Aleve [naproxen sodium]; Augmentin [amoxicillin-pot  clavulanate]; Shellfish allergy; and Tomato   Review of Systems Review of Systems  Constitutional: Negative.   HENT: Negative.   Respiratory: Negative.   Cardiovascular: Positive for palpitations.  Gastrointestinal: Positive for diarrhea.       Diarrhea after being on Augmentin for sinus infection which has resolved  Musculoskeletal: Negative.   Skin: Negative.   Neurological: Positive for numbness.  Psychiatric/Behavioral: Negative.   All other systems reviewed and are negative.    Physical Exam Updated Vital Signs BP 168/89 (BP Location: Right Arm)   Pulse 67   Temp 98.4 F (36.9 C)  (Oral)   Resp 18   Ht 5\' 6"  (1.676 m)   Wt 174 lb 2.6 oz (79 kg)   SpO2 99%   BMI 28.11 kg/m   Physical Exam  Constitutional: She appears well-developed and well-nourished. No distress.  HENT:  Head: Normocephalic and atraumatic.  Eyes: Conjunctivae are normal. Pupils are equal, round, and reactive to light.  Neck: Neck supple. No tracheal deviation present. No thyromegaly present.  Cardiovascular: Normal rate and regular rhythm.   No murmur heard. Pulmonary/Chest: Effort normal and breath sounds normal.  Abdominal: Soft. Bowel sounds are normal. She exhibits no distension. There is no tenderness.  Musculoskeletal: Normal range of motion. She exhibits no edema or tenderness.  Neurological: She is alert. Coordination normal.  Skin: Skin is warm and dry. No rash noted.  Psychiatric: She has a normal mood and affect.  Nursing note and vitals reviewed.    ED Treatments / Results  DIAGNOSTIC STUDIES:  Oxygen Saturation is 99% on RA, normal by my interpretation.    COORDINATION OF CARE:  6:54 PM Discussed treatment plan with pt at bedside and pt agreed to plan.   Labs (all labs ordered are listed, but only abnormal results are displayed) Labs Reviewed - No data to display  EKG  EKG Interpretation  Date/Time:  Monday February 06 2016 18:29:26 EST Ventricular Rate:  68 PR Interval:  140 QRS Duration: 78 QT Interval:  418 QTC Calculation: 444 R Axis:   75 Text Interpretation:  Normal sinus rhythm Normal ECG No significant change since last tracing Confirmed by Winfred Leeds  MD, Alejandra Hunt [phone]) on 02/06/2016 6:31:53 PM      Results for orders placed or performed during the hospital encounter of [id number]  Basic metabolic panel  Result Value Ref Range   Sodium 139 135 - 145 mmol/L   Potassium 3.6 3.5 - 5.1 mmol/L   Chloride 104 101 - 111 mmol/L   CO2 26 22 - 32 mmol/L   Glucose, Bld 103 (H) 65 - 99 mg/dL   BUN 13 6 - 20 mg/dL   Creatinine, Ser 0.82 0.44 - 1.00 mg/dL    Calcium 8.5 (L) 8.9 - 10.3 mg/dL   GFR calc non Af Amer >60 >60 mL/min   GFR calc Af Amer >60 >60 mL/min   Anion gap 9 5 - 15  CBC with Differential/Platelet  Result Value Ref Range   WBC 6.6 4.0 - 10.5 K/uL   RBC 4.37 3.87 - 5.11 MIL/uL   Hemoglobin 13.6 12.0 - 15.0 g/dL   HCT 39.7 36.0 - 46.0 %   MCV 90.8 78.0 - 100.0 fL   MCH 31.1 26.0 - 34.0 pg   MCHC 34.3 30.0 - 36.0 g/dL   RDW 12.3 11.5 - 15.5 %   Platelets 232 150 - 400 K/uL   Neutrophils Relative % 57 %   Neutro Abs 3.7 1.7 - 7.7 K/uL  Lymphocytes Relative 32 %   Lymphs Abs 2.1 0.7 - 4.0 K/uL   Monocytes Relative 10 %   Monocytes Absolute 0.7 0.1 - 1.0 K/uL   Eosinophils Relative 1 %   Eosinophils Absolute 0.1 0.0 - 0.7 K/uL   Basophils Relative 0 %   Basophils Absolute 0.0 0.0 - 0.1 K/uL  Troponin I  Result Value Ref Range   Troponin I <0.03 <0.03 ng/mL   No results found.  Radiology No results found.  Procedures Procedures (including critical care time)  Medications Ordered in ED Medications - No data to display  7:10 PM patient resting comfortably. Asymptomatic. Initial Impression / Assessment and Plan / ED Course  I have reviewed the triage vital signs and the nursing notes.  Pertinent labs & imaging results that were available during my care of the patient were reviewed by me and considered in my medical decision making (see chart for details).  Clinical Course   Strongly doubt cardiac etiology of symptoms. No chest pain normally EKG and troponin after a full day's worth of symptoms. Doubt stroke. No focal findings on exam. I advised the patient she can stop her antibiotic Ceftin. As she states she no longer has symptoms from sinus infection. pLan Follow-up with PMD or return if symptoms worsen    Final Clinical Impressions(s) / ED Diagnoses  Diagnosis palpitations Final diagnoses:  None    New Prescriptions New Prescriptions   No medications on file       Orlie Dakin, MD 02/06/16  2014

## 2016-02-06 NOTE — Discharge Instructions (Signed)
Return or see your primary care physician if concern for any reason. It is okay to stop Cefnidir

## 2016-02-06 NOTE — ED Triage Notes (Addendum)
C/o heart palpitations started yesterday-c/o tingling to left side of face and arm started at 4pm-tingling is less-sent from urgent care-denies pain-being treated with abx for sinus x 1 week-NAD-steady gait

## 2016-02-06 NOTE — ED Notes (Signed)
Pt assisted to bathroom. Pt in NAD.

## 2016-02-06 NOTE — ED Triage Notes (Signed)
Adult female with pt added that pt has been under a lot of stress

## 2016-03-08 ENCOUNTER — Encounter: Payer: Self-pay | Admitting: Internal Medicine

## 2016-03-08 ENCOUNTER — Ambulatory Visit (INDEPENDENT_AMBULATORY_CARE_PROVIDER_SITE_OTHER): Payer: 59 | Admitting: Internal Medicine

## 2016-03-08 VITALS — BP 106/64 | HR 68 | Ht 65.25 in | Wt 170.1 lb

## 2016-03-08 DIAGNOSIS — K2100 Gastro-esophageal reflux disease with esophagitis, without bleeding: Secondary | ICD-10-CM

## 2016-03-08 DIAGNOSIS — K219 Gastro-esophageal reflux disease without esophagitis: Secondary | ICD-10-CM

## 2016-03-08 NOTE — Patient Instructions (Signed)
   Keep doing what your doing and come back to see Dr Carlean Purl in 6 months.      I appreciate the opportunity to care for you. Silvano Rusk, MD, [facility]

## 2016-03-08 NOTE — Progress Notes (Signed)
Whitney Gonzales 61 y.o. 1955/09/23 [id number]  Assessment & Plan:   Encounter Diagnoses  Name Primary?  . Gastroesophageal reflux disease with esophagitis Yes  . Laryngopharyngeal reflux (LPR)     GERD with esophagitis and LPR-  Improving with medication.  Continue current regimen of Protonix 40mg  once daily before meals.    Follow-up in 6 months  Edwyn Inclan, PA-S  I have seen the patient w/ Ms. Bobby Barton and she has served as a Education administrator. Gatha Mayer, MD, [facility]   Subjective:   Chief Complaint: 4 month follow-up of hoarseness and sore throat  HPI 61 year old female presents for follow-up regarding hoarseness, sore throat, and reflux symptoms.  She states the hoarseness and sore throat have improved.  However, she is still having reflux symptoms but she can not recall specific triggers.  Tums helps relieve symptoms.  Denies chronic cough, N/V/D.  Family hx significant for laryngeal cancer in mother.     Current Meds  Medication Sig  . acetaminophen (TYLENOL) 500 MG tablet Take 500 mg by mouth every 6 (six) hours as needed.  Marland Kitchen escitalopram (LEXAPRO) 20 MG tablet Take 1 tablet by mouth daily.  . pantoprazole (PROTONIX) 40 MG tablet TAKE 1 TABLET BY MOUTH DAILY BEFORE BREAKFAST.  Marland Kitchen zolpidem (AMBIEN CR) 6.25 MG CR tablet Take 6.25 mg by mouth at bedtime as needed for sleep.  . [DISCONTINUED] CEFDINIR PO Take by mouth.    Allergies  Allergen Reactions  . Chicken Allergy Hives, Swelling, Rash and Other (See Comments)    Sinus swelling, migraine  . Aleve [Naproxen Sodium] Itching  . Augmentin [Amoxicillin-Pot Clavulanate] Diarrhea  . Shellfish Allergy Hives  . Tomato Hives    Past Medical History:  Diagnosis Date  . Anal fissure   . Chronic headaches   . HLD (hyperlipidemia)   . Hx of adenomatous polyp of colon 07/11/2015  . Hyperthyroidism   . 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 Laryngopharyngeal reflux (LPR)   . Obesity   . Osteopenia    . Reflux esophagitis    Past Surgical History:  Procedure Laterality Date  . APPENDECTOMY    . COLONOSCOPY WITH PROPOFOL    . ESOPHAGOGASTRODUODENOSCOPY    . HEMORRHOID SURGERY  1993    Family History  Problem Relation Age of Onset  . Cancer Mother     laryngeal  . Liver cancer Paternal Aunt   . Diabetes Maternal Grandfather   . Heart disease Maternal Grandfather   . Diabetes Maternal Grandmother   . Diabetes Paternal Grandfather   . Diabetes Paternal Grandmother     Social History   Social History  . Marital status: Married    Spouse name: N/A  . Number of children: 1  . Years of education: N/A   Occupational History  . senior mask designer    Social History Main Topics  . Smoking status: Never Smoker  . Smokeless tobacco: Never Used  . Alcohol use 0.0 oz/week     Comment: occ  . Drug use: No  . Sexual activity: Not on file   Other Topics Concern  . Not on file   Social History Narrative  . No narrative on file      Review of Systems All other ROS negative   Objective:   Physical Exam BP 106/64 (BP Location: Left Arm, Patient Position: Sitting, Cuff Size: Normal)   Pulse 68   Ht 5' 5.25" (1.657 m)   Wt 170  lb 2 oz (77.2 kg)   BMI 28.09 kg/m  GA: Well developed, well nourished female in no acute distress Mouth: Oral mucosa moist, no ulcerations or erythema Heart: RRR, no m/r/g Lungs: CTAB Alert and oriented x 3 Psych: Mood and affect normal

## 2016-04-24 ENCOUNTER — Other Ambulatory Visit: Payer: Self-pay | Admitting: Internal Medicine

## 2016-04-25 DIAGNOSIS — G4721 Circadian rhythm sleep disorder, delayed sleep phase type: Secondary | ICD-10-CM | POA: Diagnosis not present

## 2016-05-09 DIAGNOSIS — E0500 Thyrotoxicosis with diffuse goiter without thyrotoxic crisis or storm: Secondary | ICD-10-CM | POA: Diagnosis not present

## 2016-05-09 DIAGNOSIS — Z0001 Encounter for general adult medical examination with abnormal findings: Secondary | ICD-10-CM | POA: Diagnosis not present

## 2016-05-09 DIAGNOSIS — M8580 Other specified disorders of bone density and structure, unspecified site: Secondary | ICD-10-CM | POA: Diagnosis not present

## 2016-05-09 DIAGNOSIS — E559 Vitamin D deficiency, unspecified: Secondary | ICD-10-CM | POA: Diagnosis not present

## 2016-05-16 DIAGNOSIS — Z1231 Encounter for screening mammogram for malignant neoplasm of breast: Secondary | ICD-10-CM | POA: Diagnosis not present

## 2016-05-17 DIAGNOSIS — R922 Inconclusive mammogram: Secondary | ICD-10-CM | POA: Diagnosis not present

## 2016-06-05 ENCOUNTER — Encounter: Payer: Self-pay | Admitting: Internal Medicine

## 2016-06-05 ENCOUNTER — Ambulatory Visit (INDEPENDENT_AMBULATORY_CARE_PROVIDER_SITE_OTHER): Payer: 59 | Admitting: Internal Medicine

## 2016-06-05 VITALS — BP 100/66 | HR 71 | Ht 66.0 in | Wt 171.0 lb

## 2016-06-05 DIAGNOSIS — K219 Gastro-esophageal reflux disease without esophagitis: Secondary | ICD-10-CM

## 2016-06-05 DIAGNOSIS — K2100 Gastro-esophageal reflux disease with esophagitis, without bleeding: Secondary | ICD-10-CM

## 2016-06-05 MED ORDER — DEXLANSOPRAZOLE 60 MG PO CPDR: 60.0000 mg | DELAYED_RELEASE_CAPSULE | Freq: Every day | ORAL | 2 refills | Status: DC

## 2016-06-05 NOTE — Patient Instructions (Signed)
Stop taking pantoprazole.   We have sent the following medications to your pharmacy for you to pick up at your convenience: [facility].  Take over the counter Gaviscon at bedtime for gas and bloating.   Please follow up with [place] in 2 months.  I appreciate the opportunity to care for you.

## 2016-06-05 NOTE — Progress Notes (Signed)
   Whitney Gonzales 61 y.o. 05/22/1955 [id number]  Assessment & Plan:   Encounter Diagnoses  Name Primary?  . Gastroesophageal reflux disease with esophagitis Yes  . Laryngopharyngeal reflux (LPR)      She seems to be failing twice a day 40 mg pantoprazole. Question if she has some weakly acidic her nonacidic reflux. She was probably on Nexium at some point in the past as well.  Plan is to try toDexilant 60 mg daily and Gaviscon when necessary and probably regularly at bedtime.  Return to clinic in 2 months  I appreciate the opportunity to care for this patient. CC: Osa Craver, MD   Subjective:   Chief Complaint:Esophageal burning  HPI Patient is here for follow-up, middle-aged white woman with known  GERD and mild esophagitis with a  irregular Z line and intestinal metaplasia. She had improved on 40 mg daily pantoprazole when I had seen her in the office in August. Then a couple months ago she started having more esophageal burning symptoms. She went to twice a day on on 40 mg pantoprazole taking it before breakfast and at bedtime and set in the first week or 2 she had some improvement but then subsequently she's been having intermittent esophageal burning symptoms, often at night. She does tend to wait 3 hours before bedtime after eating. She denies any dysphagia. Her hoarseness and sore throat are better overall. He is been no major weight gain. She limits caffeine and reflux of genic foods.  Allergies  Allergen Reactions  . Chicken Allergy Hives, Swelling, Rash and Other (See Comments)    Sinus swelling, migraine  . Aleve [Naproxen Sodium] Itching  . Augmentin [Amoxicillin-Pot Clavulanate] Diarrhea  . Shellfish Allergy Hives  . Tomato Hives   Current Meds  Medication Sig  . acetaminophen (TYLENOL) 500 MG tablet Take 500 mg by mouth every 6 (six) hours as needed.  . Vitamin D, Ergocalciferol, (DRISDOL) 50000 units CAPS capsule   . zolpidem (AMBIEN CR) 6.25 MG CR  tablet Take 6.25 mg by mouth at bedtime as needed for sleep.  . [DISCONTINUED] pantoprazole (PROTONIX) 40 MG tablet TAKE 1 TABLET BY MOUTH DAILY BEFORE BREAKFAST.  . [DISCONTINUED] pantoprazole (PROTONIX) 40 MG tablet Take 40 mg by mouth 2 (two) times daily.   Past Medical History:  Diagnosis Date  . Anal fissure   . Chronic headaches   . HLD (hyperlipidemia)   . Hx of adenomatous polyp of colon 07/11/2015  . Hyperthyroidism   . 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 Laryngopharyngeal reflux (LPR)   . Obesity   . Osteopenia   . Reflux esophagitis    Past Surgical History:  Procedure Laterality Date  . APPENDECTOMY    . COLONOSCOPY WITH PROPOFOL    . ESOPHAGOGASTRODUODENOSCOPY    . [place]      Review of Systems Wt Readings from Last 3 Encounters:  06/05/16 171 lb (77.6 kg)  03/08/16 170 lb 2 oz (77.2 kg)  02/06/16 174 lb 2.6 oz (79 kg)     Objective:   Physical Exam BP 100/66   Pulse 71   Ht 5\' 6"  (1.676 m)   Wt 171 lb (77.6 kg)   BMI 27.60 kg/m  No acute distress

## 2016-06-22 DIAGNOSIS — L570 Actinic keratosis: Secondary | ICD-10-CM | POA: Diagnosis not present

## 2016-06-22 DIAGNOSIS — D224 Melanocytic nevi of scalp and neck: Secondary | ICD-10-CM | POA: Diagnosis not present

## 2016-06-22 DIAGNOSIS — D485 Neoplasm of uncertain behavior of skin: Secondary | ICD-10-CM | POA: Diagnosis not present

## 2016-06-22 DIAGNOSIS — L820 Inflamed seborrheic keratosis: Secondary | ICD-10-CM | POA: Diagnosis not present

## 2016-06-22 DIAGNOSIS — D2272 Melanocytic nevi of left lower limb, including hip: Secondary | ICD-10-CM | POA: Diagnosis not present

## 2016-06-22 DIAGNOSIS — D225 Melanocytic nevi of trunk: Secondary | ICD-10-CM | POA: Diagnosis not present

## 2016-07-09 DIAGNOSIS — R202 Paresthesia of skin: Secondary | ICD-10-CM | POA: Diagnosis not present

## 2016-07-11 ENCOUNTER — Encounter: Payer: Self-pay | Admitting: Neurology

## 2016-07-30 DIAGNOSIS — J019 Acute sinusitis, unspecified: Secondary | ICD-10-CM | POA: Diagnosis not present

## 2016-08-07 DIAGNOSIS — G5622 Lesion of ulnar nerve, left upper limb: Secondary | ICD-10-CM | POA: Diagnosis not present

## 2016-08-10 DIAGNOSIS — J0100 Acute maxillary sinusitis, unspecified: Secondary | ICD-10-CM | POA: Diagnosis not present

## 2016-08-10 DIAGNOSIS — B338 Other specified viral diseases: Secondary | ICD-10-CM | POA: Diagnosis not present

## 2016-08-16 ENCOUNTER — Ambulatory Visit (INDEPENDENT_AMBULATORY_CARE_PROVIDER_SITE_OTHER): Payer: 59 | Admitting: Internal Medicine

## 2016-08-16 ENCOUNTER — Encounter: Payer: Self-pay | Admitting: Internal Medicine

## 2016-08-16 VITALS — BP 110/60 | HR 82 | Ht 66.0 in | Wt 171.0 lb

## 2016-08-16 DIAGNOSIS — K3189 Other diseases of stomach and duodenum: Secondary | ICD-10-CM

## 2016-08-16 DIAGNOSIS — K2100 Gastro-esophageal reflux disease with esophagitis, without bleeding: Secondary | ICD-10-CM

## 2016-08-16 DIAGNOSIS — K219 Gastro-esophageal reflux disease without esophagitis: Secondary | ICD-10-CM | POA: Diagnosis not present

## 2016-08-16 DIAGNOSIS — K31A0 Gastric intestinal metaplasia, unspecified: Secondary | ICD-10-CM

## 2016-08-16 NOTE — Progress Notes (Signed)
Whitney Gonzales 61 y.o. [birth year] [id number]  Assessment & Plan:   Encounter Diagnoses  Name Primary?  . Gastroesophageal reflux disease with esophagitis Yes  . Laryngopharyngeal reflux (LPR)   . 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 I think she should stay on this for a while, see me in 6 months and we can discuss possible dose reduction though for whatever reason she may need to stay on this dose. She is finally somewhat better. She is going to Dr. Benjamine Gonzales  soon for her sinusitis problems. Her mother died of laryngeal cancer and that has her concerned somewhat. During the upper endoscopy and had a brief look at that area and didn't see any problems but I suggest that she has Dr. Benjamine Gonzales  evaluate with laryngoscopy when his evaluating her sinus problems.  She had intestinal metaplasia of the GE junction of unclear significance. It's a small area probably not Barrett's. I think is reasonable to consider re-looking at that area 5 years from her EGD. Subjective:   Chief Complaint: Follow-up for cirrhosis and reflux  HPI  Whitney Gonzales reports that she is doing well on her current regimen. Unfortunately she said sinus problems and is having take prednisone and antibiotics. She has a headache today. She is going to otolaryngology soon. Her mother had laryngeal cancer which is on her mind about it given her problems with LPR. Allergies  Allergen Reactions  . Chicken Allergy Hives, Swelling, Rash and Other (See Comments)    Sinus swelling, migraine  . Aleve [Naproxen Sodium] Itching  . Augmentin [Amoxicillin-Pot Clavulanate] Diarrhea  . Shellfish Allergy Hives  . Tomato Hives   Current Meds  Medication Sig  . acetaminophen (TYLENOL) 500 MG tablet Take 500 mg by mouth every 6 (six) hours as needed.  Marland Kitchen dexlansoprazole (DEXILANT) 60 MG capsule Take 1 capsule (60 mg total) by mouth daily before breakfast.  . FLUoxetine (PROZAC)  20 MG tablet Take 20 mg by mouth daily.  Marland Kitchen zolpidem (AMBIEN CR) 6.25 MG CR tablet Take 6.25 mg by mouth at bedtime as needed for sleep.  . [DISCONTINUED] Vitamin D, Ergocalciferol, (DRISDOL) 50000 units CAPS capsule    Past Medical History:  Diagnosis Date  . Anal fissure   . Chronic headaches   . GERD (gastroesophageal reflux disease)   . HLD (hyperlipidemia)   . Hx of adenomatous polyp of colon 07/11/2015  . Hyperthyroidism   . 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 Laryngopharyngeal reflux (LPR)   . Obesity   . Osteopenia   . Reflux esophagitis    Past Surgical History:  Procedure Laterality Date  . APPENDECTOMY    . COLONOSCOPY WITH PROPOFOL    . ESOPHAGOGASTRODUODENOSCOPY    . [place]   Social History   Social History  . Marital status: Married    Spouse name: N/A  . Number of children: 1  . Years of education: N/A   Occupational History  . senior mask designer    Social History Main Topics  . Smoking status: Never Smoker  . Smokeless tobacco: Never Used  . Alcohol use 0.0 oz/week     Comment: occ  . Drug use: No   family history includes Cancer in her mother; Diabetes in her maternal grandfather, maternal grandmother, paternal grandfather, and paternal grandmother; Heart disease in her maternal grandfather; Liver cancer in her paternal  aunt.   Review of Systems  As above Objective:   Physical Exam    BP 110/60   Pulse 82   Ht 5\' 6"  (1.676 m)   Wt 171 lb (77.6 kg)   BMI 27.60 kg/m  No acute distress  15 minutes time spent with patient > half in counseling coordination of care

## 2016-08-16 NOTE — Assessment & Plan Note (Signed)
Will consider repeat EGD recall 2023

## 2016-08-16 NOTE — Patient Instructions (Addendum)
  Per Dr Carlean Purl stay on your Dexilant.   Follow up with [place] in 6 months.   We are going to add an EGD to your colonoscopy recall time. 06/2020.    I appreciate the opportunity to care for you. Silvano Rusk, MD, [facility]

## 2016-08-18 DIAGNOSIS — G5622 Lesion of ulnar nerve, left upper limb: Secondary | ICD-10-CM | POA: Diagnosis not present

## 2016-08-27 DIAGNOSIS — G5622 Lesion of ulnar nerve, left upper limb: Secondary | ICD-10-CM | POA: Diagnosis not present

## 2016-08-31 ENCOUNTER — Other Ambulatory Visit: Payer: Self-pay | Admitting: Internal Medicine

## 2016-09-18 DIAGNOSIS — J343 Hypertrophy of nasal turbinates: Secondary | ICD-10-CM | POA: Diagnosis not present

## 2016-09-18 DIAGNOSIS — J310 Chronic rhinitis: Secondary | ICD-10-CM | POA: Diagnosis not present

## 2016-09-18 DIAGNOSIS — J342 Deviated nasal septum: Secondary | ICD-10-CM | POA: Diagnosis not present

## 2016-09-19 ENCOUNTER — Other Ambulatory Visit (INDEPENDENT_AMBULATORY_CARE_PROVIDER_SITE_OTHER): Payer: Self-pay | Admitting: Otolaryngology

## 2016-09-19 DIAGNOSIS — J329 Chronic sinusitis, unspecified: Secondary | ICD-10-CM

## 2016-09-21 ENCOUNTER — Ambulatory Visit
Admission: RE | Admit: 2016-09-21 | Discharge: 2016-09-21 | Disposition: A | Discharge status: Home / Self Care | Payer: 59 | Source: Ambulatory Visit | Attending: Otolaryngology | Admitting: Otolaryngology

## 2016-09-21 DIAGNOSIS — J329 Chronic sinusitis, unspecified: Secondary | ICD-10-CM | POA: Diagnosis not present

## 2016-10-05 ENCOUNTER — Ambulatory Visit: Payer: 59 | Admitting: Neurology

## 2016-10-17 DIAGNOSIS — J342 Deviated nasal septum: Secondary | ICD-10-CM | POA: Diagnosis not present

## 2016-10-17 DIAGNOSIS — J310 Chronic rhinitis: Secondary | ICD-10-CM | POA: Diagnosis not present

## 2016-10-17 DIAGNOSIS — J343 Hypertrophy of nasal turbinates: Secondary | ICD-10-CM | POA: Diagnosis not present

## 2016-10-24 DIAGNOSIS — G4721 Circadian rhythm sleep disorder, delayed sleep phase type: Secondary | ICD-10-CM | POA: Diagnosis not present

## 2017-01-29 DIAGNOSIS — E559 Vitamin D deficiency, unspecified: Secondary | ICD-10-CM | POA: Diagnosis not present

## 2017-03-01 ENCOUNTER — Telehealth: Payer: Self-pay | Admitting: Internal Medicine

## 2017-03-01 NOTE — Telephone Encounter (Signed)
Patient states she had a change in ins and now her medication dexilant is too expensive. Pt wanted to discuss other options.

## 2017-03-01 NOTE — Telephone Encounter (Signed)
Spoke with Lovada and the [place] with her insurance is going to run $270 a month, cash price $169 per month.  I told her to call her insurance company and get options that they cover and let me know.  Then we can see which one Dr Carlean Purl recommends.

## 2017-03-04 ENCOUNTER — Telehealth: Payer: Self-pay | Admitting: Internal Medicine

## 2017-03-04 NOTE — Telephone Encounter (Signed)
She should try lansoprazole 30 mg bid before breakfast and supper  # 180 w/ 3 RF

## 2017-03-04 NOTE — Telephone Encounter (Signed)
I called and told Blen and she wants to call her pharmacy and see if they cover it and she will then call me back with which pharmacy to send it to, [facility] or [facility].

## 2017-03-04 NOTE — Telephone Encounter (Signed)
Whitney Gonzales called back and wants to know what Dr Carlean Purl suggest she try before she calls her insurance company.  She has tried omeprazole and pantoprazole in the past and both were ineffective. She is totally out of her Dexilant.  Thank you.

## 2017-03-04 NOTE — Telephone Encounter (Signed)
Spoke with Arbie Cookey and combined this phone note with the previous one.

## 2017-03-13 MED ORDER — LANSOPRAZOLE 30 MG PO CPDR: 30.0000 mg | DELAYED_RELEASE_CAPSULE | Freq: Two times a day (BID) | ORAL | 11 refills | Status: DC

## 2017-03-13 NOTE — Addendum Note (Signed)
Addended by: [place], Arlow Spiers E on: 03/13/2017 02:44 PM   Modules accepted: Orders

## 2017-03-13 NOTE — Telephone Encounter (Signed)
Spoke with Arbie Cookey and she request I send in a months worth of the lansoprazole to [facility] to try.

## 2017-03-14 ENCOUNTER — Telehealth: Payer: Self-pay

## 2017-03-14 NOTE — Telephone Encounter (Signed)
I have done a prior authorization thru CoverMyMeds for patient's lansoprazole 30mg  capsules, One capsule twice daily.  Dx- GERD K21.9, she has tried omeprazole and Dexilant.  We will await a response.

## 2017-03-18 NOTE — Telephone Encounter (Signed)
The lansoprazole has been denied.  Patient has to try and fail both rabeprazole and prevacid 24 hours.  Please advise Sir, thank you.

## 2017-03-18 NOTE — Telephone Encounter (Signed)
Called Whitney Gonzales to tell her what we were going to do and she said over the weekend [facility] to get the lansoprazole for $20.00.  She started it over the weekend and will let us know if she has problems with it.

## 2017-03-18 NOTE — Telephone Encounter (Signed)
rabeprazole 20 mg bid # 180 3 RF

## 2017-04-03 DIAGNOSIS — E559 Vitamin D deficiency, unspecified: Secondary | ICD-10-CM | POA: Diagnosis not present

## 2017-04-18 ENCOUNTER — Other Ambulatory Visit: Payer: Self-pay

## 2017-04-18 ENCOUNTER — Other Ambulatory Visit: Payer: Self-pay | Admitting: Internal Medicine

## 2017-04-18 MED ORDER — LANSOPRAZOLE 30 MG PO CPDR: 30.0000 mg | DELAYED_RELEASE_CAPSULE | Freq: Two times a day (BID) | ORAL | 11 refills | Status: DC

## 2017-04-18 NOTE — Telephone Encounter (Signed)
Refills on file for the prevacid already. Resent as directed.

## 2017-04-25 DIAGNOSIS — G4721 Circadian rhythm sleep disorder, delayed sleep phase type: Secondary | ICD-10-CM | POA: Diagnosis not present

## 2017-05-10 DIAGNOSIS — E559 Vitamin D deficiency, unspecified: Secondary | ICD-10-CM | POA: Diagnosis not present

## 2017-05-10 DIAGNOSIS — E0500 Thyrotoxicosis with diffuse goiter without thyrotoxic crisis or storm: Secondary | ICD-10-CM | POA: Diagnosis not present

## 2017-05-10 DIAGNOSIS — Z124 Encounter for screening for malignant neoplasm of cervix: Secondary | ICD-10-CM | POA: Diagnosis not present

## 2017-05-10 DIAGNOSIS — Z0000 Encounter for general adult medical examination without abnormal findings: Secondary | ICD-10-CM | POA: Diagnosis not present

## 2017-05-17 DIAGNOSIS — Z1231 Encounter for screening mammogram for malignant neoplasm of breast: Secondary | ICD-10-CM | POA: Diagnosis not present

## 2017-05-22 DIAGNOSIS — M8589 Other specified disorders of bone density and structure, multiple sites: Secondary | ICD-10-CM | POA: Diagnosis not present

## 2017-06-19 DIAGNOSIS — R1084 Generalized abdominal pain: Secondary | ICD-10-CM | POA: Diagnosis not present

## 2017-06-24 ENCOUNTER — Telehealth: Payer: Self-pay | Admitting: Internal Medicine

## 2017-06-24 NOTE — Telephone Encounter (Signed)
Pt experiencing abd pain on and off for the past 5 days, wants to know what she can do

## 2017-06-24 NOTE — Telephone Encounter (Signed)
Patient reports intermittent abdominal pain for the last week.  She reports that it started about one week ago.  She is advised at this current time I do not have any openings and she should start with her primary care MD.  If they find a GI concern they can refer her back here.  She verbalized understanding.

## 2017-06-25 DIAGNOSIS — R1013 Epigastric pain: Secondary | ICD-10-CM | POA: Diagnosis not present

## 2017-07-03 DIAGNOSIS — D225 Melanocytic nevi of trunk: Secondary | ICD-10-CM | POA: Diagnosis not present

## 2017-07-03 DIAGNOSIS — L821 Other seborrheic keratosis: Secondary | ICD-10-CM | POA: Diagnosis not present

## 2017-07-03 DIAGNOSIS — D2272 Melanocytic nevi of left lower limb, including hip: Secondary | ICD-10-CM | POA: Diagnosis not present

## 2017-10-24 DIAGNOSIS — G4700 Insomnia, unspecified: Secondary | ICD-10-CM | POA: Diagnosis not present

## 2017-10-31 DIAGNOSIS — Z23 Encounter for immunization: Secondary | ICD-10-CM | POA: Diagnosis not present

## 2018-05-15 IMAGING — CT CT MAXILLOFACIAL W/O CM
1 series · 15 of 30 positions shown, 19 images · non-contrast
Comparison: CT head without contrast 09/04/2013.

CLINICAL DATA: Chronic sinus infections. Pain, pressure, and
swelling about the left orbit with impact on vision.

EXAM:
CT MAXILLOFACIAL WITHOUT CONTRAST
TECHNIQUE: Multidetector CT images of the paranasal sinuses were obtained using
the standard protocol without intravenous contrast.

[Series 4: soft tissue (person_name) · axial · 0.46mm/px · z∈[-218,-14]mm · 15 of 220 slices shown, 19 images]
[im 8/220  brain]
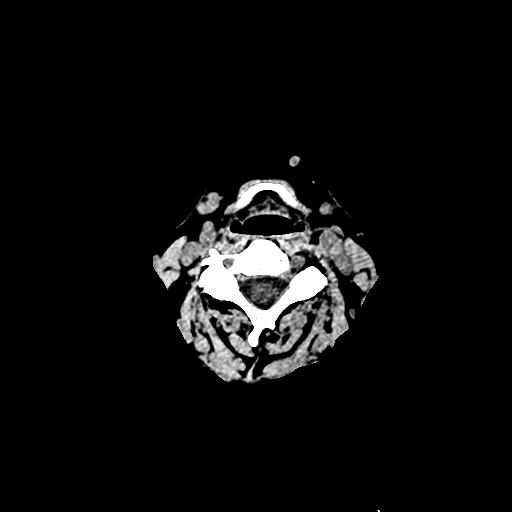
[im 8/220  bone]
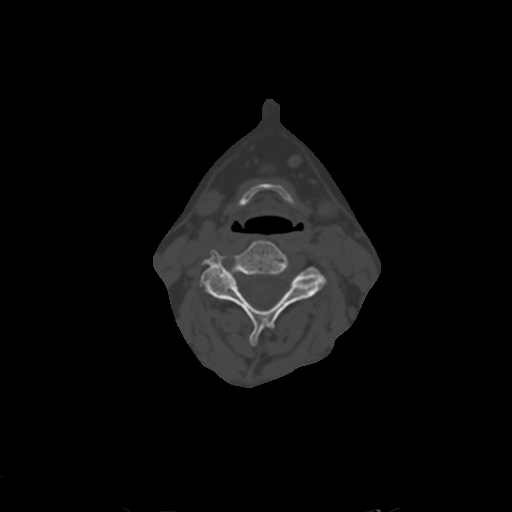
[im 23/220  bone]
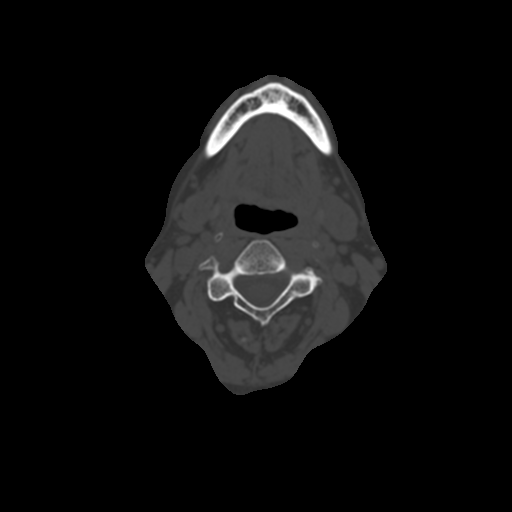
[im 38/220  bone]
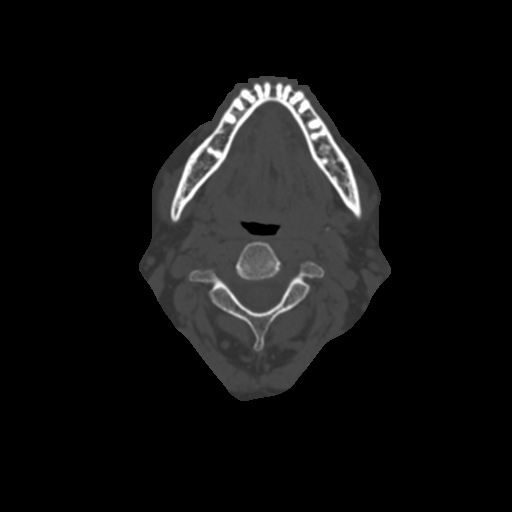
[im 53/220  bone]
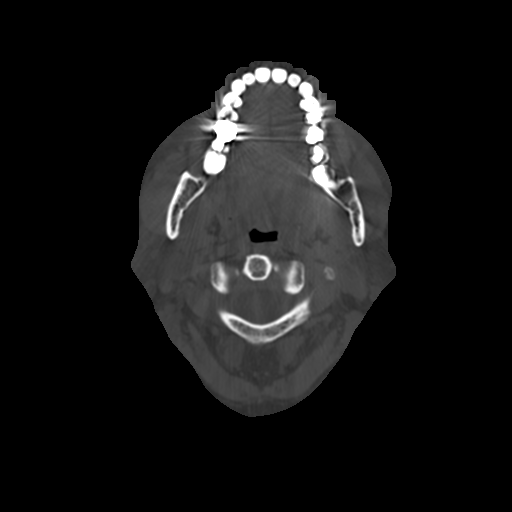
[im 68/220  brain]
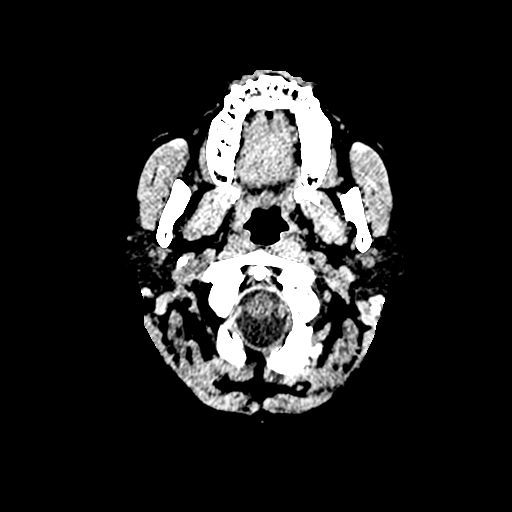
[im 68/220  bone]
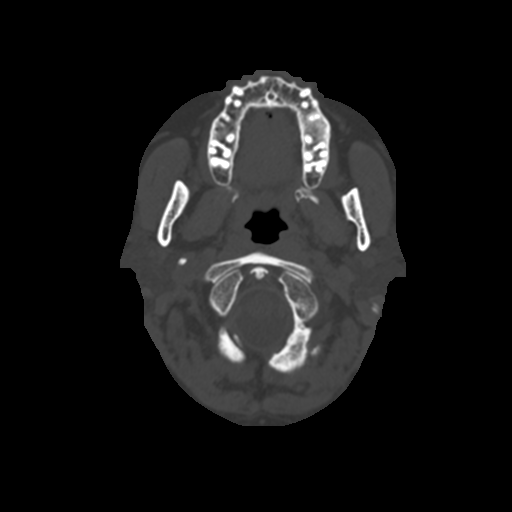
[im 84/220  bone]
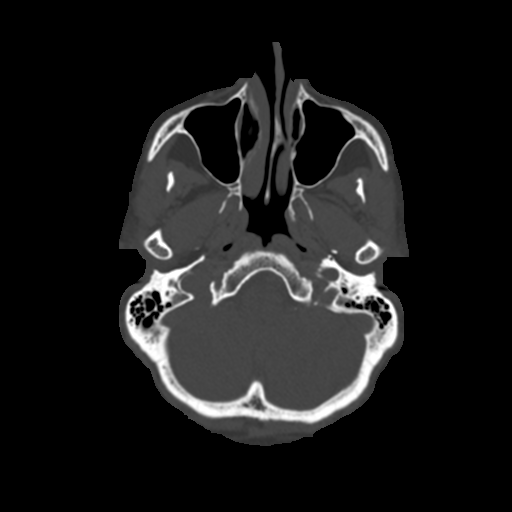
[im 99/220  bone]
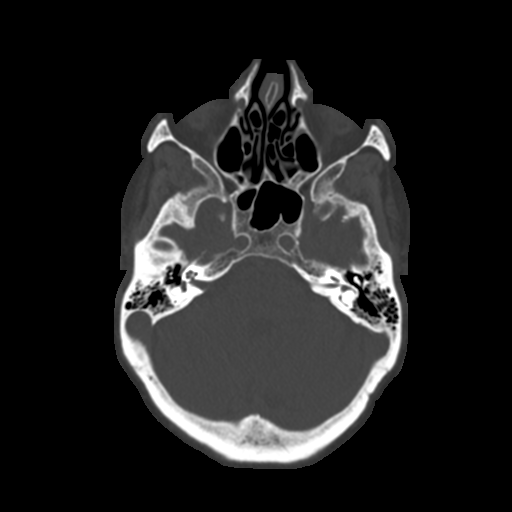
[im 114/220  bone]
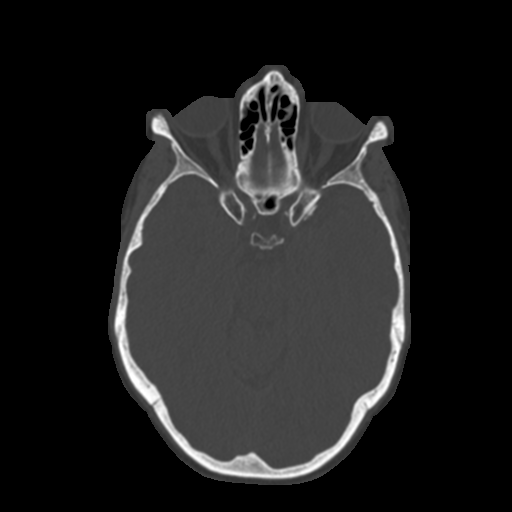
[im 121/220  brain]
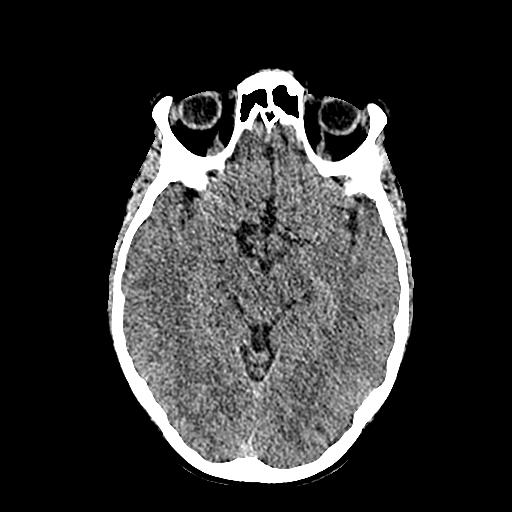
[im 121/220  bone]
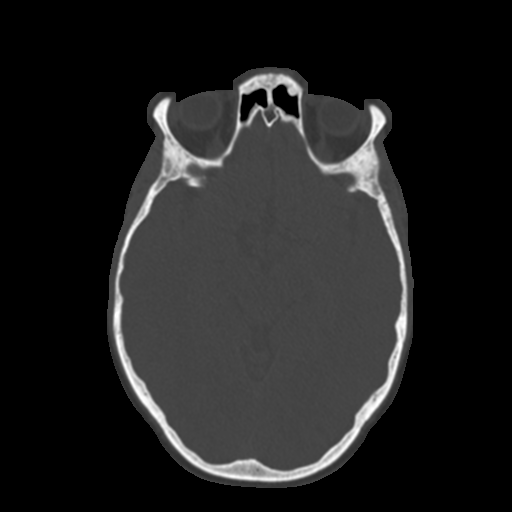
[im 136/220  bone]
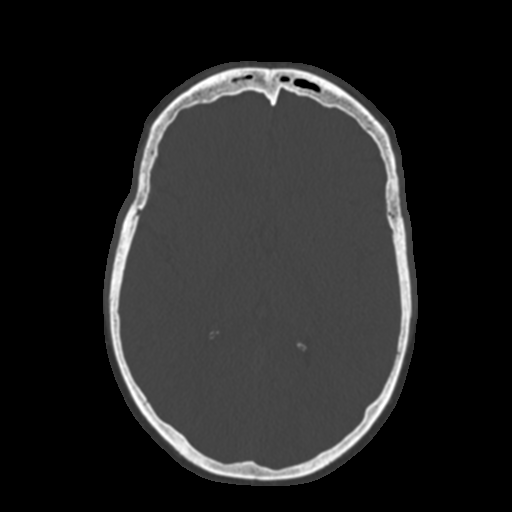
[im 152/220  bone]
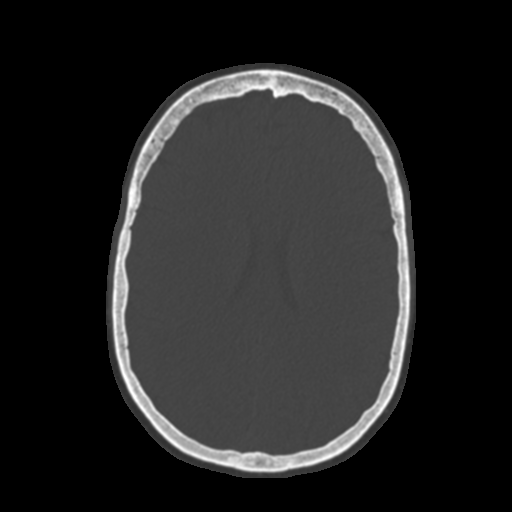
[im 167/220  bone]
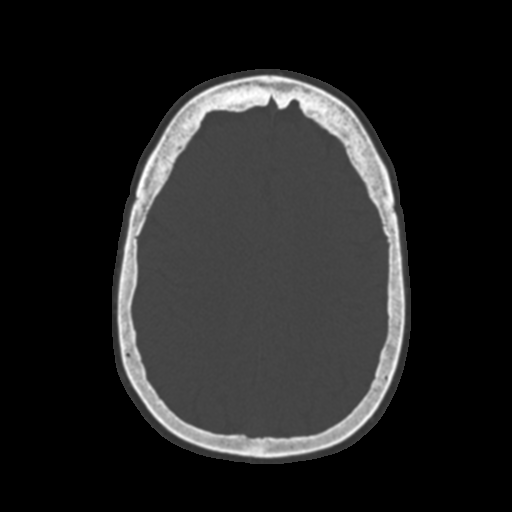
[im 182/220  brain]
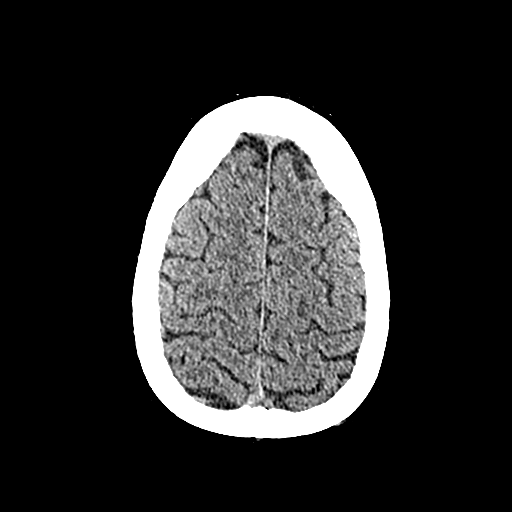
[im 182/220  bone]
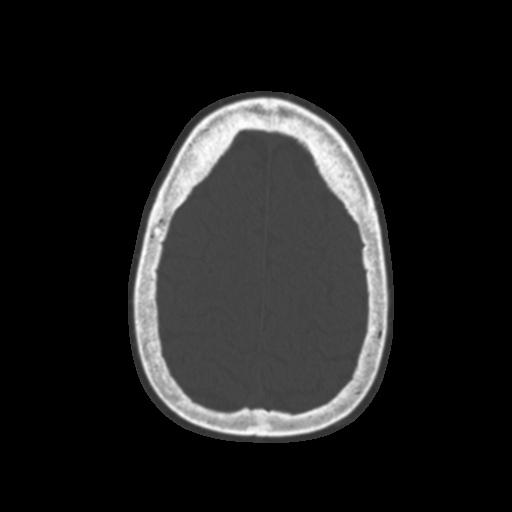
[im 197/220  bone]
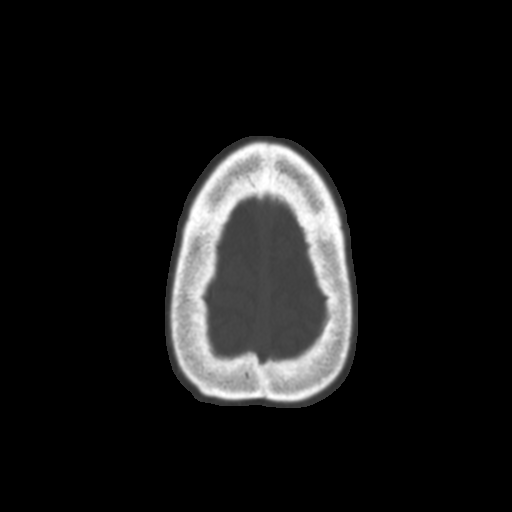
[im 212/220  bone]
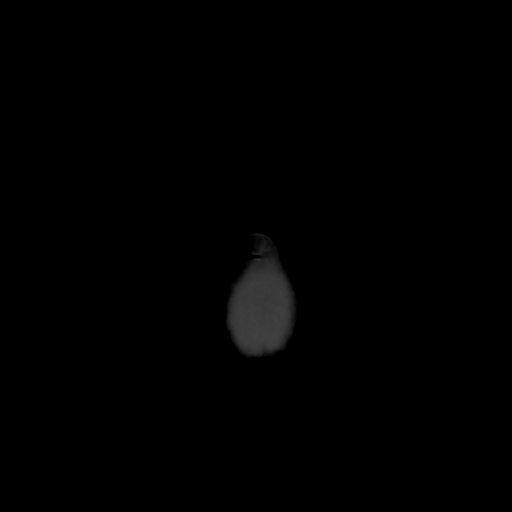

[15 of 30 positions shown; findings below may reference images not displayed]

FINDINGS: Paranasal sinuses:

Frontal: Normally aerated. Patent frontal sinus drainage pathways.

Ethmoid: Normally aerated.

Maxillary: Mild mucosal thickening is noted along the floor of the
maxillary sinus bilaterally. The maxillary sinuses are otherwise
clear bilaterally.

Sphenoid: Normally aerated. Patent sphenoethmoidal recesses.

Right ostiomeatal unit: Patent.

Left ostiomeatal unit: Patent.

Nasal passages: The nasal cavity is clear. Leftward nasal septal
deviation is 6 mm to the left of midline. This impacts the left
inferior turbinate. A concha bullosa is noted in the middle
turbinate bilaterally without obstruction.

Anatomy: No pneumatization superior to anterior ethmoid notches.
Symmetric and intact olfactory grooves and fovea ethmoidalis, Keros
II (4-7mm) Presellar sphenoid pneumatization pattern. No dehiscence
of carotid or optic canals. No onodi cell.

Other: Orbits and intracranial compartment are unremarkable. Visible
mastoid air cells are normally aerated.
IMPRESSION: 1. Minimal mucosal thickening along the inferior frontal sinuses.
2. No other significant sinus disease.
3. The ostiomeatal unit is patent bilaterally.
4. Prominent middle turbinate concha bullosa bilaterally without
obstruction.
5. Leftward nasal septal spurring without nasal obstruction. This
does impact the left inferior turbinate.

## 2018-08-25 ENCOUNTER — Other Ambulatory Visit: Payer: Self-pay | Admitting: Internal Medicine

## 2018-08-26 ENCOUNTER — Telehealth: Payer: Self-pay | Admitting: Internal Medicine

## 2018-08-26 ENCOUNTER — Telehealth: Payer: Self-pay

## 2018-08-26 MED ORDER — LANSOPRAZOLE 30 MG PO CPDR: 30.0000 mg | DELAYED_RELEASE_CAPSULE | Freq: Every day | ORAL | 0 refills | Status: DC

## 2018-08-26 NOTE — Telephone Encounter (Signed)
Patient called and stated that she got the price for the medication through [facility] and that you do not need to run it through insurance.

## 2018-08-26 NOTE — Telephone Encounter (Signed)
Did prior authorization thru [employer] for patients Lansoprazole 30mg  capsules, once daily per patient. It kicked back and said no prior authorization needed for once daily. I called [facility] and spoke with Dorothea Ogle and resent the rx for once daily. He will get it ready. I called and told Briyah and she also said she found a [facility] coupon so when she goes she will see which is better.

## 2018-08-26 NOTE — Telephone Encounter (Signed)
I spoke with Arbie Cookey, see other phone note from today for more details.

## 2018-09-22 ENCOUNTER — Other Ambulatory Visit: Payer: Self-pay

## 2018-09-22 MED ORDER — LANSOPRAZOLE 30 MG PO CPDR: 30.0000 mg | DELAYED_RELEASE_CAPSULE | Freq: Every day | ORAL | 0 refills | Status: DC

## 2018-09-22 NOTE — Telephone Encounter (Signed)
Lansoprazole refilled and note attached to call for appointment.

## 2019-01-24 ENCOUNTER — Other Ambulatory Visit: Payer: Self-pay | Admitting: Internal Medicine

## 2019-04-23 ENCOUNTER — Other Ambulatory Visit: Payer: Self-pay | Admitting: Internal Medicine

## 2019-05-19 ENCOUNTER — Other Ambulatory Visit: Payer: Self-pay | Admitting: Internal Medicine

## 2019-05-21 ENCOUNTER — Ambulatory Visit: Payer: 59 | Attending: Internal Medicine

## 2019-05-21 DIAGNOSIS — Z23 Encounter for immunization: Secondary | ICD-10-CM

## 2019-05-21 NOTE — Progress Notes (Signed)
   Covid-19 Vaccination Clinic  Name:  Whitney Gonzales    MRN: [id number] DOB: [birth year]  05/21/2019  Whitney Gonzales was observed post Covid-19 immunization for 15 minutes without incident. She was provided with Vaccine Information Sheet and instruction to access the V-Safe system.   Whitney Gonzales was instructed to call 911 with any severe reactions post vaccine: Marland Kitchen Difficulty breathing  . Swelling of face and throat  . A fast heartbeat  . A bad rash all over body  . Dizziness and weakness   Immunizations Administered    Name Date Dose VIS Date Route   Pfizer COVID-19 Vaccine 05/21/2019  1:04 PM 0.3 mL 02/06/2019 Intramuscular   Manufacturer: [place]   Lot: [id number]   [place]: [id number]

## 2019-05-25 ENCOUNTER — Telehealth: Payer: Self-pay | Admitting: Internal Medicine

## 2019-05-25 NOTE — Telephone Encounter (Signed)
I left patient a message to call and set up appointment as she has not been seen since 07/2016.

## 2019-05-25 NOTE — Telephone Encounter (Signed)
Patient called requesting refill on Lansoprazole to be sent to [facility] also asked for a 3 month supply

## 2019-05-27 ENCOUNTER — Other Ambulatory Visit: Payer: Self-pay | Admitting: Internal Medicine

## 2019-05-27 MED ORDER — LANSOPRAZOLE 30 MG PO CPDR: DELAYED_RELEASE_CAPSULE | ORAL | 0 refills | Status: DC

## 2019-05-27 NOTE — Addendum Note (Signed)
Addended by: [place], Rocio Roam E on: 05/27/2019 05:14 PM   Modules accepted: Orders

## 2019-05-27 NOTE — Telephone Encounter (Signed)
Lansoprazole refill sent in as requested since appointment made.

## 2019-05-27 NOTE — Telephone Encounter (Signed)
Pt is scheduled for OV 06/25/19.  Please send a refill for lansoprazole to [facility].

## 2019-06-15 ENCOUNTER — Ambulatory Visit: Payer: 59 | Attending: Internal Medicine

## 2019-06-15 DIAGNOSIS — Z23 Encounter for immunization: Secondary | ICD-10-CM

## 2019-06-15 NOTE — Progress Notes (Signed)
   Covid-19 Vaccination Clinic  Name:  Whitney Gonzales    MRN: [id number] DOB: 1955/08/02  06/15/2019  [name] was observed post Covid-19 immunization for 15 minutes without incident. She was provided with Vaccine Information Sheet and instruction to access the V-Safe system.   [name] was instructed to call 911 with any severe reactions post vaccine: Marland Kitchen Difficulty breathing  . Swelling of face and throat  . A fast heartbeat  . A bad rash all over body  . Dizziness and weakness   Immunizations Administered    Name Date Dose VIS Date Route   Pfizer COVID-19 Vaccine 06/15/2019  1:09 PM 0.3 mL 04/22/2018 Intramuscular   Manufacturer: [place]   Lot: [id number]   [place]: [id number]

## 2019-06-25 ENCOUNTER — Telehealth: Payer: Self-pay

## 2019-06-25 ENCOUNTER — Ambulatory Visit (INDEPENDENT_AMBULATORY_CARE_PROVIDER_SITE_OTHER): Payer: 59 | Admitting: Internal Medicine

## 2019-06-25 ENCOUNTER — Encounter: Payer: Self-pay | Admitting: Internal Medicine

## 2019-06-25 VITALS — BP 106/58 | HR 74 | Temp 98.2°F | Ht 66.0 in | Wt 174.0 lb

## 2019-06-25 DIAGNOSIS — J029 Acute pharyngitis, unspecified: Secondary | ICD-10-CM

## 2019-06-25 DIAGNOSIS — K3189 Other diseases of stomach and duodenum: Secondary | ICD-10-CM

## 2019-06-25 DIAGNOSIS — K2100 Gastro-esophageal reflux disease with esophagitis, without bleeding: Secondary | ICD-10-CM

## 2019-06-25 DIAGNOSIS — K31A0 Gastric intestinal metaplasia, unspecified: Secondary | ICD-10-CM

## 2019-06-25 MED ORDER — DEXILANT 30 MG PO CPDR: 30.0000 mg | DELAYED_RELEASE_CAPSULE | Freq: Every day | ORAL | 3 refills | Status: DC

## 2019-06-25 NOTE — Progress Notes (Signed)
Whitney Gonzales 64 y.o. [birth year] [id number]  Assessment & Plan:   Encounter Diagnoses  Name Primary?  . Gastroesophageal reflux disease with esophagitis, unspecified whether hemorrhage Yes  . Sore throat ? LPR   . 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 Allergies vs from going to qd PPI  Will go back to Dexilant - 30 mg qd - see if better on that and if cost ok now  Consider EGD if persistent sx   Cc:Whitney Dials, MD  Subjective:   Chief Complaint: GERD, sore throat  HPI Whitney Gonzales is a 64 year old woman with GERD and suspected LPR causing a sore throat.  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  She had done really well without sore throat until a few weeks ago she started having some problems again.  She does wonder if it could be all of the pollen and allergies though she is not complaining of postnasal drip or typical allergy symptoms.  She is worried about Barrett's esophagus.  She has intestinal metaplasia at the GE junction she had two 5 mm erosions on her EGD back in 2017.  We reviewed intestinal metaplasia of the GE junction and Barrett's esophagus which she does not have.    Wt Readings from Last 3 Encounters:  06/25/19 174 lb (78.9 kg)  08/16/16 171 lb (77.6 kg)  06/05/16 171 lb (77.6 kg)    Allergies  Allergen Reactions  . Naproxen Itching  . Chicken Allergy Hives, Swelling, Rash and Other (See Comments)    Sinus swelling, migraine  . Aleve [Naproxen Sodium] Itching  . Augmentin [Amoxicillin-Pot Clavulanate] Diarrhea  . Shellfish Allergy Hives  . Tomato Hives   Current Meds  Medication Sig  . acetaminophen (TYLENOL) 500 MG tablet Take 500 mg by mouth every 6 (six) hours as needed.  Marland Kitchen FLUoxetine (PROZAC) 20 MG tablet Take 20 mg by mouth daily.  . rosuvastatin (CRESTOR) 10 MG tablet Take 10 mg by mouth.  Five times a week  . zolpidem (AMBIEN CR) 6.25 MG CR tablet Take 6.25 mg by mouth at bedtime as needed for sleep.  . [DISCONTINUED] lansoprazole (PREVACID) 30 MG capsule TAKE ONE CAPSULE BY MOUTH ONE TIME DAILY before breakfast *make appointment for refills*   Past Medical History:  Diagnosis Date  . Anal fissure   . Chronic headaches   . GERD (gastroesophageal reflux disease)   . HLD (hyperlipidemia)   . Hx of adenomatous polyp of colon 07/11/2015  . Hyperthyroidism   . 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 Laryngopharyngeal reflux (LPR)   . Obesity   . Osteopenia   . Reflux esophagitis    Past Surgical History:  Procedure Laterality Date  . APPENDECTOMY    . COLONOSCOPY WITH PROPOFOL    . ESOPHAGOGASTRODUODENOSCOPY    . [place]   Social History   Social History Narrative   Building control surveyor, [employer]   Married 1 child   Occasional alcohol no drugs no tobacco   family history includes Cancer in her mother; Diabetes in her maternal grandfather, maternal grandmother, paternal grandfather, and paternal grandmother; Heart disease in her maternal grandfather; Liver cancer in her paternal aunt.   Review of Systems As above  Objective:   Physical Exam BP (!) 106/58   Pulse 74  Temp 98.2 F (36.8 C)   Ht 5\' 6"  (1.676 m)   Wt 174 lb (78.9 kg)   BMI 28.08 kg/m   21 minutes total time on visit

## 2019-06-25 NOTE — Telephone Encounter (Signed)
I have started a prior authorization for patient's Dexilant 30mg  capsules one daily for her GERD thru [place]. Will await the outcome.

## 2019-06-25 NOTE — Patient Instructions (Signed)
  We have sent the following medications to your pharmacy for you to pick up at your convenience: Dexilant   Let us know if any problems with this as far as cost, etc.    I appreciate the opportunity to care for you. Silvano Rusk, MD, [facility]

## 2019-06-29 NOTE — Telephone Encounter (Signed)
Got a note saying the prior authorization has been cancelled because the plan allows the Dexilant to be dispensed for a 31 day supply.

## 2020-03-02 ENCOUNTER — Telehealth: Payer: Self-pay | Admitting: Internal Medicine

## 2020-03-02 NOTE — Telephone Encounter (Signed)
Pt is requesting a cheaper alternative for her DEXILANT

## 2020-03-02 NOTE — Telephone Encounter (Signed)
I informed [name] that Whitney Gonzales is going generic. She will call her insurance and see what they cover and call us back.

## 2020-03-03 NOTE — Telephone Encounter (Signed)
Find out how long it has been since she has had sore throat symptoms and how long she had them after the April visit

## 2020-03-03 NOTE — Telephone Encounter (Signed)
Patient calling to follow up on previous message. 

## 2020-03-03 NOTE — Telephone Encounter (Signed)
Rosene called her insurance and her insurance said Tier one drugs are: rabeprazole, omeprazole, pantoprazole. Will send to Dr Leone Payor to advise . She is aware it will be next week before we get an answer.Marland Kitchen

## 2020-03-04 NOTE — Telephone Encounter (Signed)
Whitney Gonzales said when she takes the [place] her throat doesn't hurt. She is only having occasional reflux now. She went ahead and paid the $200 for a months worth of Dexilant. I will contact her when Dr Carlean Purl advises a replacement.

## 2020-03-07 NOTE — Telephone Encounter (Signed)
I think options are as follows:  Pantoprazole 40 mg twice a day not sure what that will cost here  Or  [facility]    Antacid/Proton Pump Inhibitors / Dexilant or Equivalent    Dexilant (Dexlansoprazole) Type Product Manufacturer Country Dosage Qty Price(USD)   drug type Dexilant  Takeda [place] 30 mg 90 $272.99  1  1 Submit drug type Dexlansoprazole  Generic [place] 30 mg 100 $74.99

## 2020-03-08 NOTE — Telephone Encounter (Signed)
Whitney Gonzales informed and she will check into the options and call us back with a decision of what she wants to do.

## 2020-04-04 NOTE — Telephone Encounter (Signed)
She is going to set up an account and we will look for the form to come thru to have Dr Carlean Purl sign.

## 2020-04-04 NOTE — Telephone Encounter (Signed)
Inbound call from patient inquiring about receiving Dexilant medication through [facility].  Please advise.

## 2020-05-05 ENCOUNTER — Other Ambulatory Visit: Payer: Self-pay | Admitting: Family Medicine

## 2020-05-06 NOTE — Telephone Encounter (Signed)
The form came today and I will get Dr Carlean Purl to sign it and I will fax it.

## 2020-05-10 MED ORDER — DEXLANSOPRAZOLE 30 MG PO CPDR: 30.0000 mg | DELAYED_RELEASE_CAPSULE | Freq: Every day | ORAL | 3 refills | Status: DC

## 2020-05-10 NOTE — Telephone Encounter (Signed)
Form signed and faxed back to [facility] at fax # [phone], phone # [phone]. I called and left Glynis a message that this has been done.

## 2020-05-19 ENCOUNTER — Other Ambulatory Visit: Payer: Self-pay | Admitting: Family Medicine

## 2020-06-16 ENCOUNTER — Other Ambulatory Visit: Payer: Self-pay | Admitting: Family Medicine

## 2020-06-16 DIAGNOSIS — K9049 Malabsorption due to intolerance, not elsewhere classified: Secondary | ICD-10-CM

## 2020-11-02 ENCOUNTER — Encounter: Payer: Self-pay | Admitting: Internal Medicine

## 2021-03-01 ENCOUNTER — Telehealth: Payer: Self-pay | Admitting: Internal Medicine

## 2021-03-01 MED ORDER — DEXLANSOPRAZOLE 30 MG PO CPDR: 30.0000 mg | DELAYED_RELEASE_CAPSULE | Freq: Every day | ORAL | 1 refills | Status: DC

## 2021-03-01 NOTE — Telephone Encounter (Signed)
Patient called and stated that [facility] contacted her and stated that they need information from the office for her proscription for [place]. Please advise.

## 2021-03-01 NOTE — Telephone Encounter (Signed)
I spoke with Whitney Gonzales and she thinks they just need a new dexilant rx so I will fax them one now. Sent rx to fax # [phone].

## 2021-04-13 ENCOUNTER — Other Ambulatory Visit: Payer: Self-pay

## 2021-04-13 MED ORDER — DEXLANSOPRAZOLE 30 MG PO CPDR: 30.0000 mg | DELAYED_RELEASE_CAPSULE | Freq: Every day | ORAL | 1 refills | Status: DC

## 2021-04-13 NOTE — Telephone Encounter (Signed)
Dexilant (brand name) refill faxed to [facility] , fax # [phone], form hand signed by Dr Carlean Purl.

## 2022-06-28 ENCOUNTER — Telehealth: Payer: Self-pay

## 2022-06-28 MED ORDER — DEXLANSOPRAZOLE 30 MG PO CPDR: 30.0000 mg | DELAYED_RELEASE_CAPSULE | Freq: Every day | ORAL | 0 refills | Status: DC

## 2022-06-28 NOTE — Telephone Encounter (Signed)
Rx has been signed and faxed  

## 2022-06-28 NOTE — Telephone Encounter (Signed)
We received a fax from [facility] to renew her generic Dexilant 30mg  capsules. I called her and she was in the bed sick with a cold. I spoke with her husband Beryle Beams and we set up an appointment for her to come for her GERD refill. I will send in enough to cover her until her appointment on 09/20/2022 at 2:30pm. RX will be signed  by Dr Leone Payor and faxed to # [phone].

## 2022-07-13 NOTE — Telephone Encounter (Signed)
I received another dexilant request from [facility] so I called them and they never received the one on 06/28/2022. I have re-faxed the rx to them at [phone], phone # [phone]. Patient informed that they have our # to call us if they don't get it this time.

## 2022-09-20 ENCOUNTER — Ambulatory Visit: Payer: 59 | Admitting: Internal Medicine

## 2022-11-06 ENCOUNTER — Other Ambulatory Visit: Payer: Self-pay | Admitting: Family Medicine

## 2022-11-06 DIAGNOSIS — R0789 Other chest pain: Secondary | ICD-10-CM

## 2022-11-22 ENCOUNTER — Ambulatory Visit
Admission: RE | Admit: 2022-11-22 | Discharge: 2022-11-22 | Disposition: A | Discharge status: Home / Self Care | Payer: No Typology Code available for payment source | Source: Ambulatory Visit | Attending: Family Medicine | Admitting: Family Medicine

## 2022-11-22 DIAGNOSIS — R0789 Other chest pain: Secondary | ICD-10-CM

## 2022-12-11 ENCOUNTER — Encounter: Payer: Self-pay | Admitting: Internal Medicine

## 2022-12-11 ENCOUNTER — Ambulatory Visit (INDEPENDENT_AMBULATORY_CARE_PROVIDER_SITE_OTHER): Payer: 59 | Admitting: Internal Medicine

## 2022-12-11 VITALS — BP 118/68 | HR 76 | Ht 66.0 in | Wt 187.0 lb

## 2022-12-11 DIAGNOSIS — Z860101 Personal history of adenomatous and serrated colon polyps: Secondary | ICD-10-CM

## 2022-12-11 DIAGNOSIS — R1310 Dysphagia, unspecified: Secondary | ICD-10-CM

## 2022-12-11 DIAGNOSIS — K31A0 Gastric intestinal metaplasia, unspecified: Secondary | ICD-10-CM

## 2022-12-11 DIAGNOSIS — K2100 Gastro-esophageal reflux disease with esophagitis, without bleeding: Secondary | ICD-10-CM | POA: Diagnosis not present

## 2022-12-11 DIAGNOSIS — R1319 Other dysphagia: Secondary | ICD-10-CM

## 2022-12-11 NOTE — Patient Instructions (Signed)
  You have been scheduled for an endoscopy. Please follow written instructions given to you at your visit today.  If you use inhalers (even only as needed), please bring them with you on the day of your procedure.  If you take any of the following medications, they will need to be adjusted prior to your procedure:   DO NOT TAKE 7 DAYS PRIOR TO TEST- Trulicity (dulaglutide) Ozempic, Wegovy (semaglutide) Mounjaro (tirzepatide) Bydureon Bcise (exanatide extended release)  DO NOT TAKE 1 DAY PRIOR TO YOUR TEST Rybelsus (semaglutide) Adlyxin (lixisenatide) Victoza (liraglutide) Byetta (exanatide) ___________________________________________________________________________    I appreciate the opportunity to care for you. Stan Head, MD, [facility]

## 2022-12-11 NOTE — Progress Notes (Unsigned)
Whitney Gonzales 67 y.o. 1955/12/16 [id number]  Assessment & Plan:   Encounter Diagnoses  Name Primary?   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  She also had a hyperplastic gastric polyp in the past.  Continue Dexilant at this time.   Schedule colonoscopy for polyp surveillance.  When she was in the office we were going to wait until 2027 because of a change in recall letter that was issued but I realized that that was an error and I had intended for her to be rechecked this year with respect to history of colon polyp.  I called her and explained and she was understanding and we will arrange for an EGD and colonoscopy double procedure.   Subjective:   Chief Complaint: GERD, ? Needs EGD and repeat colonoscopy  HPI Discussed the use of AI scribe software for clinical note transcription with the patient, who gave verbal consent to proceed.  History of Present Illness   The patient, with a history of GERD managed on daily Dexilant 30mg , presents for a follow-up consultation. She reports that the medication is mostly effective, but she experiences symptoms of fullness and occasional regurgitation after consuming large meals, approximately once every one to two weeks. She has found that smaller meals are better tolerated. The patient also reports a sensation of food getting stuck in the mid-sternal area about once a month, which resolves with fluid intake. This symptom has been present for several years.  She has been managing any breakthrough GERD symptoms with Gaviscon as needed.  In 2017, the patient underwent a colonoscopy, during which a small precancerous polyp was found and removed. She also underwent an endoscopy, which  revealed a small hiatal hernia and some inflammation at the junction of the esophagus and stomach. The patient's mother had a history of laryngeal cancer, and her father recently passed away from pancreatic cancer. The patient has been obtaining her Dexilant from [facility] due to the lower cost.        Wt Readings from Last 3 Encounters:  12/11/22 187 lb (84.8 kg)  06/25/19 174 lb (78.9 kg)  08/16/16 171 lb (77.6 kg)   EGD 07/07/2015  - Normal hypopharynx.                           - Normal larynx.                           - LA Grade A reflux esophagitis. Biopsied.                           - LA Grade A reflux esophagitis. Biopsied.                           - Small hiatal hernia.                           - A single gastric polyp. Resected and retrieved.                           -  The examination was otherwise normal.  Colonoscopy 07/07/2015 - One 5 mm polyp in the ascending colon, removed                            with a cold snare. Resected and retrieved.                           - The examination was otherwise normal on direct                            and retroflexion views.  1. 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 - CHRONIC INFLAMMATION. - NO DYSPLASIA OR MALIGNANCY. 2. Surgical [P], mid esophagus esophagitis - BENIGN SQUAMOUS MUCOSA. - NO INTESTINAL METAPLASIA, DYSPLASIA, OR MALIGNANCY. 3. Surgical [P], gastric body polyp - HYPERPLASTIC POLYP. - NEGATIVE FOR HELICOBACTER PYLORI. - NO INTESTINAL METAPLASIA, DYSPLASIA, OR MALIGNANCY. 4. Surgical [P], ascending colon, polyp - SESSILE SERRATED POLYP/ADENOMA. - NO DYSPLASIA OR MALIGNANCY Allergies  Allergen Reactions   Naproxen Itching   Chicken Allergy Hives, Swelling, Rash and Other (See Comments)    Sinus swelling, migraine   Aleve [Naproxen Sodium] Itching   Augmentin [Amoxicillin-Pot Clavulanate] Diarrhea    Shellfish Allergy Hives   Tomato Hives   Current Meds  Medication Sig   acetaminophen (TYLENOL) 500 MG tablet Take 500 mg by mouth every 6 (six) hours as needed.   Dexlansoprazole (DEXILANT) 30 MG capsule DR Take 1 capsule (30 mg total) by mouth daily before breakfast.   FLUoxetine (PROZAC) 20 MG tablet Take 20 mg by mouth daily.   rosuvastatin (CRESTOR) 10 MG tablet Take 10 mg by mouth. Five times a week   zolpidem (AMBIEN CR) 6.25 MG CR tablet Take 6.25 mg by mouth at bedtime as needed for sleep.   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 06/22/2015   Laryngopharyngeal reflux (LPR)    Obesity    Osteopenia    Reflux esophagitis    Past Surgical History:  Procedure Laterality Date   APPENDECTOMY     COLONOSCOPY WITH PROPOFOL     ESOPHAGOGASTRODUODENOSCOPY     HEMORRHOID SURGERY  1993   Social History   Social History Narrative   Water quality scientist, Occupational hygienist   Married 1 child   Occasional alcohol no drugs no tobacco   family history includes Cancer in her mother; Diabetes in her maternal grandfather, maternal grandmother, paternal grandfather, and paternal grandmother; Heart disease in her maternal grandfather; Liver cancer in her paternal aunt.   Review of Systems As per HPI otherwise negative  Objective:   Physical Exam BP 118/68   Pulse 76   Ht 5\' 6"  (1.676 m)   Wt 187 lb (84.8 kg)   BMI 30.18 kg/m  Physical Exam   CHEST: lungs clear to auscultation CARDIOVASCULAR: normal S1 and S2, no murmurs

## 2022-12-12 ENCOUNTER — Telehealth: Payer: Self-pay | Admitting: Internal Medicine

## 2022-12-12 ENCOUNTER — Other Ambulatory Visit: Payer: Self-pay

## 2022-12-12 DIAGNOSIS — R1319 Other dysphagia: Secondary | ICD-10-CM

## 2022-12-12 DIAGNOSIS — Z860101 Personal history of adenomatous and serrated colon polyps: Secondary | ICD-10-CM

## 2022-12-12 NOTE — Telephone Encounter (Signed)
She is set up for an EGD tomorrow on October 17.  I had seen her in the office yesterday on October 15.  At that time I thought her colonoscopy was not due till 2027 but I misinterpreted something and she is due now for a repeat colonoscopy.  I explained this to her and she understands.  Plan:  #1 is to cancel EGD October 17  #2 schedule EGD and colonoscopy for October 22 at 330 and 4 PM  #3 she needs prep instructions and she may use MiraLAX   Thanks  Encounter Diagnoses  Name Primary?   Esophageal dysphagia Yes   Hx of adenomatous polyp of colon

## 2022-12-12 NOTE — Telephone Encounter (Signed)
EGD canceled  EGD and colonoscopy scheduled  for October 22 at 3:30 PM and 4:00  PM: Pt made aware.  Prep letter sent to pt. Pt made aware  Ambulatory referral to GI placed in Epic. Pt verbalized understanding with all questions answered.

## 2022-12-13 ENCOUNTER — Encounter: Payer: 59 | Admitting: Internal Medicine

## 2022-12-13 ENCOUNTER — Encounter: Payer: Self-pay | Admitting: Internal Medicine

## 2022-12-18 ENCOUNTER — Encounter: Payer: Self-pay | Admitting: Internal Medicine

## 2022-12-18 ENCOUNTER — Ambulatory Visit: Payer: 59 | Admitting: Internal Medicine

## 2022-12-18 VITALS — BP 131/70 | HR 70 | Temp 98.4°F | Resp 14 | Ht 66.0 in | Wt 187.0 lb

## 2022-12-18 DIAGNOSIS — D122 Benign neoplasm of ascending colon: Secondary | ICD-10-CM | POA: Diagnosis not present

## 2022-12-18 DIAGNOSIS — Z09 Encounter for follow-up examination after completed treatment for conditions other than malignant neoplasm: Secondary | ICD-10-CM

## 2022-12-18 DIAGNOSIS — K635 Polyp of colon: Secondary | ICD-10-CM | POA: Diagnosis not present

## 2022-12-18 DIAGNOSIS — D123 Benign neoplasm of transverse colon: Secondary | ICD-10-CM

## 2022-12-18 DIAGNOSIS — K222 Esophageal obstruction: Secondary | ICD-10-CM | POA: Diagnosis not present

## 2022-12-18 DIAGNOSIS — Z860101 Personal history of adenomatous and serrated colon polyps: Secondary | ICD-10-CM | POA: Diagnosis not present

## 2022-12-18 DIAGNOSIS — R1319 Other dysphagia: Secondary | ICD-10-CM

## 2022-12-18 DIAGNOSIS — K317 Polyp of stomach and duodenum: Secondary | ICD-10-CM | POA: Diagnosis not present

## 2022-12-18 NOTE — Op Note (Signed)
[facility] Patient Name: Whitney Gonzales Procedure Date: 12/18/2022 2:54 PM MRN: [id number] Endoscopist: Iva Boop , MD, [id number] Age: 67 Referring MD:  Date of Birth: 1955-03-27 Gender: Female Account #: [id number] Procedure:                Upper GI endoscopy Indications:              Dysphagia, Gastro-esophageal reflux disease Medicines:                Monitored Anesthesia Care Procedure:                Pre-Anesthesia Assessment:                           - Prior to the procedure, a History and Physical                            was performed, and patient medications and                            allergies were reviewed. The patient's tolerance of                            previous anesthesia was also reviewed. The risks                            and benefits of the procedure and the sedation                            options and risks were discussed with the patient.                            All questions were answered, and informed consent                            was obtained. Prior Anticoagulants: The patient has                            taken no anticoagulant or antiplatelet agents. ASA                            Grade Assessment: II - A patient with mild systemic                            disease. After reviewing the risks and benefits,                            the patient was deemed in satisfactory condition to                            undergo the procedure.                           After obtaining informed consent, the endoscope was  passed under direct vision. Throughout the                            procedure, the patient's blood pressure, pulse, and                            oxygen saturations were monitored continuously. The                            Olympus Scope [id number] was introduced through the                            mouth, and advanced to the second part of duodenum.                            The  upper GI endoscopy was accomplished without                            difficulty. The patient tolerated the procedure                            well. Scope In: Scope Out: Findings:                 Patchy mucosal changes characterized by                            discoloration were found in the lower third of the                            esophagus. Biopsies were taken with a cold forceps                            for histology. Verification of patient                            identification for the specimen was done. Estimated                            blood loss was minimal.                           One benign-appearing, intrinsic mild stenosis was                            found at the gastroesophageal junction. The                            stenosis was traversed. A TTS dilator was passed                            through the scope. Dilation with an 18-19-20 mm                            balloon dilator was performed to  20 mm. The                            dilation site was examined and showed no change.                            Estimated blood loss: none.                           A 3 cm hiatal hernia was present.                           The gastroesophageal flap valve was visualized                            endoscopically and classified as Hill Grade IV (no                            fold, wide open lumen, hiatal hernia present).                           Multiple diminutive sessile polyps with no stigmata                            of recent bleeding were found in the gastric body.                            Biopsies were taken with a cold forceps for                            histology. Verification of patient identification                            for the specimen was done. Estimated blood loss was                            minimal.                           The exam was otherwise without abnormality.                           The cardia and gastric fundus  were normal on                            retroflexion. Complications:            No immediate complications. Estimated Blood Loss:     Estimated blood loss was minimal. Impression:               - Discolored mucosa in the esophagus. Biopsied.                           - Benign-appearing esophageal stenosis. Dilated.                           -  3 cm hiatal hernia.                           - Gastroesophageal flap valve classified as Hill                            Grade IV (no fold, wide open lumen, hiatal hernia                            present).                           - Multiple gastric polyps. Biopsied.                           - The examination was otherwise normal. Recommendation:           - Patient has a contact number available for                            emergencies. The signs and symptoms of potential                            delayed complications were discussed with the                            patient. Return to normal activities tomorrow.                            Written discharge instructions were provided to the                            patient.                           - Clear liquids x 1 hour then soft foods rest of                            day. Start prior diet tomorrow.                           - Continue present medications.                           - Await pathology results.                           - See the other procedure note for documentation of                            additional recommendations. Iva Boop, MD 12/18/2022 3:48:17 PM This report has been signed electronically.

## 2022-12-18 NOTE — Progress Notes (Signed)
History and Physical Interval Note:  12/18/2022 2:59 PM  Whitney Gonzales  has presented today for endoscopic procedure(s), with the diagnosis of  Encounter Diagnoses  Name Primary?   Esophageal dysphagia Yes   Hx of adenomatous polyp of colon   .  The various methods of evaluation and treatment have been discussed with the patient and/or family. After consideration of risks, benefits and other options for treatment, the patient has consented to  the endoscopic procedure(s).   The patient's history has been reviewed, patient examined, no change in status, stable for endoscopic procedure(s).  I have reviewed the patient's chart and labs.  Questions were answered to the patient's satisfaction.     Iva Boop, MD, [facility]

## 2022-12-18 NOTE — Progress Notes (Signed)
Called to room to assist during endoscopic procedure.  Patient ID and intended procedure confirmed with present staff. Received instructions for my participation in the procedure from the performing physician.  

## 2022-12-18 NOTE — Patient Instructions (Addendum)
Please read handouts provided. Continue present medications. Dilation Diet. Await pathology results.   YOU HAD AN ENDOSCOPIC PROCEDURE TODAY AT THE [facility]:   Refer to the procedure report that was given to you for any specific questions about what was found during the examination.  If the procedure report does not answer your questions, please call your gastroenterologist to clarify.  If you requested that your care partner not be given the details of your procedure findings, then the procedure report has been included in a sealed envelope for you to review at your convenience later.  YOU SHOULD EXPECT: Some feelings of bloating in the abdomen. Passage of more gas than usual.  Walking can help get rid of the air that was put into your GI tract during the procedure and reduce the bloating. If you had a lower endoscopy (such as a colonoscopy or flexible sigmoidoscopy) you may notice spotting of blood in your stool or on the toilet paper. If you underwent a bowel prep for your procedure, you may not have a normal bowel movement for a few days.  Please Note:  You might notice some irritation and congestion in your nose or some drainage.  This is from the oxygen used during your procedure.  There is no need for concern and it should clear up in a day or so.  SYMPTOMS TO REPORT IMMEDIATELY:  Following lower endoscopy (colonoscopy or flexible sigmoidoscopy):  Excessive amounts of blood in the stool  Significant tenderness or worsening of abdominal pains  Swelling of the abdomen that is new, acute  Fever of 100F or higher  Following upper endoscopy (EGD)  Vomiting of blood or coffee ground material  New chest pain or pain under the shoulder blades  Painful or persistently difficult swallowing  New shortness of breath  Fever of 100F or higher  Black, tarry-looking stools  For urgent or emergent issues, a gastroenterologist can be reached at any hour by calling [phone]  [phone]. Do not use MyChart messaging for urgent concerns.    DIET:  We do recommend a small meal at first, but then you may proceed to your regular diet.  Drink plenty of fluids but you should avoid alcoholic beverages for 24 hours.  ACTIVITY:  You should plan to take it easy for the rest of today and you should NOT DRIVE or use heavy machinery until tomorrow (because of the sedation medicines used during the test).    FOLLOW UP: Our staff will call the number listed on your records the next business day following your procedure.  We will call around 7:15- 8:00 am to check on you and address any questions or concerns that you may have regarding the information given to you following your procedure. If we do not reach you, we will leave a message.     If any biopsies were taken you will be contacted by phone or by letter within the next 1-3 weeks.  Please call us at [phone] if you have not heard about the biopsies in 3 weeks.    SIGNATURES/CONFIDENTIALITY: You and/or your care partner have signed paperwork which will be entered into your electronic medical record.  These signatures attest to the fact that that the information above on your After Visit Summary has been reviewed and is understood.  Full responsibility of the confidentiality of this discharge information lies with you and/or your care-partner.There was some discoloration of the esophagus - probably not an issue - biopsied. I stretched the esophagus  also. Other findings - some stomach polyps - tiny and look innocent - biopsied. Small hiatal hernia.  Colonoscopy w/ 2 tiny polyps - removed. Hemorrhoids are a little swollen as you know.  I will let you know pathology results and when to have another routine colonoscopy by mail and/or My Chart.  I appreciate the opportunity to care for you. Iva Boop, MD, [facility]

## 2022-12-18 NOTE — Progress Notes (Signed)
Patient did cough during procedure. Oropharynx suctioned. Clear secretions withdrawn; nothing gastric noted. MD aware.  Uneventful anesthetic. Report to pacu rn. Vss. Care resumed by rn.

## 2022-12-18 NOTE — Op Note (Signed)
[facility] Patient Name: Whitney Gonzales Procedure Date: 12/18/2022 2:53 PM MRN: [id number] Endoscopist: Iva Boop , MD, [id number] Age: 67 Referring MD:  Date of Birth: 08/11/1955 Gender: Female Account #: [id number] Procedure:                Colonoscopy Indications:              Surveillance: Personal history of adenomatous                            polyps on last colonoscopy > 5 years ago, Last                            colonoscopy: May 2017 Medicines:                Monitored Anesthesia Care Procedure:                Pre-Anesthesia Assessment:                           - Prior to the procedure, a History and Physical                            was performed, and patient medications and                            allergies were reviewed. The patient's tolerance of                            previous anesthesia was also reviewed. The risks                            and benefits of the procedure and the sedation                            options and risks were discussed with the patient.                            All questions were answered, and informed consent                            was obtained. Prior Anticoagulants: The patient has                            taken no anticoagulant or antiplatelet agents. ASA                            Grade Assessment: II - A patient with mild systemic                            disease. After reviewing the risks and benefits,                            the patient was deemed in satisfactory condition to  undergo the procedure.                           After obtaining informed consent, the colonoscope                            was passed under direct vision. Throughout the                            procedure, the patient's blood pressure, pulse, and                            oxygen saturations were monitored continuously. The                            Olympus [id number] ([id number]) Colonoscope  was                            introduced through the anus and advanced to the the                            cecum, identified by appendiceal orifice and                            ileocecal valve. The colonoscopy was performed                            without difficulty. The patient tolerated the                            procedure well. The quality of the bowel                            preparation was good. The ileocecal valve,                            appendiceal orifice, and rectum were photographed.                            The bowel preparation used was Miralax via split                            dose instruction. Scope In: 3:28:02 PM Scope Out: 3:39:52 PM Scope Withdrawal Time: 0 hours 9 minutes 35 seconds  Total Procedure Duration: 0 hours 11 minutes 50 seconds  Findings:                 Hemorrhoids were found on perianal exam.                           Two sessile polyps were found in the transverse                            colon and ascending colon. The polyps were  diminutive in size. These polyps were removed with                            a cold snare. Resection and retrieval were                            complete. Verification of patient identification                            for the specimen was done. Estimated blood loss was                            minimal.                           The exam was otherwise without abnormality on                            direct and retroflexion views. Complications:            No immediate complications. Estimated Blood Loss:     Estimated blood loss was minimal. Impression:               - Hemorrhoids found on perianal exam.                           - Two diminutive polyps in the transverse colon and                            in the ascending colon, removed with a cold snare.                            Resected and retrieved.                           - The examination was otherwise  normal on direct                            and retroflexion views.                           - Personal history of colonic polyp - 5 mm adenoma                            2017 Recommendation:           - Patient has a contact number available for                            emergencies. The signs and symptoms of potential                            delayed complications were discussed with the                            patient. Return to normal activities tomorrow.  Written discharge instructions were provided to the                            patient.                           - Clear liquids x 1 hour then soft foods rest of                            day. Start prior diet tomorrow.                           - Continue present medications.                           - Await pathology results.                           - Repeat colonoscopy is recommended. The                            colonoscopy date will be determined after pathology                            results from today's exam become available for                            review. Iva Boop, MD 12/18/2022 3:52:08 PM This report has been signed electronically.

## 2022-12-19 ENCOUNTER — Telehealth: Payer: Self-pay

## 2022-12-19 NOTE — Telephone Encounter (Signed)
  Follow up Call-     12/18/2022    2:32 PM  Call back number  Post procedure Call Back phone  # [phone]  Permission to leave phone message Yes    Post op call attempted, no answer, left WM.

## 2022-12-21 LAB — SURGICAL PATHOLOGY

## 2022-12-23 ENCOUNTER — Encounter: Payer: Self-pay | Admitting: Internal Medicine

## 2023-07-15 ENCOUNTER — Telehealth (INDEPENDENT_AMBULATORY_CARE_PROVIDER_SITE_OTHER): Payer: Self-pay

## 2023-07-31 ENCOUNTER — Encounter (INDEPENDENT_AMBULATORY_CARE_PROVIDER_SITE_OTHER): Payer: Self-pay | Admitting: Otolaryngology

## 2023-07-31 ENCOUNTER — Ambulatory Visit (INDEPENDENT_AMBULATORY_CARE_PROVIDER_SITE_OTHER): Admitting: Otolaryngology

## 2023-07-31 VITALS — BP 75/39 | HR 81 | Ht 66.0 in | Wt 176.0 lb

## 2023-07-31 DIAGNOSIS — H903 Sensorineural hearing loss, bilateral: Secondary | ICD-10-CM

## 2023-07-31 DIAGNOSIS — R42 Dizziness and giddiness: Secondary | ICD-10-CM

## 2023-07-31 DIAGNOSIS — H8111 Benign paroxysmal vertigo, right ear: Secondary | ICD-10-CM | POA: Diagnosis not present

## 2023-07-31 DIAGNOSIS — H9193 Unspecified hearing loss, bilateral: Secondary | ICD-10-CM | POA: Diagnosis not present

## 2023-08-01 DIAGNOSIS — R42 Dizziness and giddiness: Secondary | ICD-10-CM | POA: Insufficient documentation

## 2023-08-01 DIAGNOSIS — H903 Sensorineural hearing loss, bilateral: Secondary | ICD-10-CM | POA: Insufficient documentation

## 2023-08-01 DIAGNOSIS — H8111 Benign paroxysmal vertigo, right ear: Secondary | ICD-10-CM | POA: Insufficient documentation

## 2023-08-01 NOTE — Progress Notes (Signed)
 CC: Recurrent dizziness  HPI:  Whitney Gonzales is a 68 y.o. female who presents today complaining of recurrent dizziness for 20+ years.  She describes her dizziness as a spinning vertigo that lasts for minutes.  After the spinning vertigo, she typically has difficulty with her balance for several weeks.  Her last vertigo was 2 months ago.  She was previously treated with meclizine, Zyrtec, and Ambien.  She denies any otalgia, otorrhea, aural pressure, or recent change in her hearing.  She has a history of bilateral high-frequency tinnitus.  She also has a history of bilateral sensorineural hearing loss.  She currently wears bilateral hearing aids.  Past Medical History:  Diagnosis Date   Anal fissure    Chronic headaches    GERD (gastroesophageal reflux disease)    HLD (hyperlipidemia)    Hx of adenomatous polyp of colon 07/11/2015   Intestinal metaplasia of gastric mucosa - GE junction 06/22/2015   Laryngopharyngeal reflux (LPR)    Obesity    Osteopenia    Reflux esophagitis     Past Surgical History:  Procedure Laterality Date   APPENDECTOMY     COLONOSCOPY WITH PROPOFOL     ESOPHAGOGASTRODUODENOSCOPY     HEMORRHOID SURGERY  1993    Family History  Problem Relation Age of Onset   Cancer Mother        laryngeal   Liver cancer Paternal Aunt    Diabetes Maternal Grandfather    Heart disease Maternal Grandfather    Diabetes Maternal Grandmother    Diabetes Paternal Grandfather    Diabetes Paternal Grandmother     Social History:  reports that she has never smoked. She has never used smokeless tobacco. She reports current alcohol use. She reports that she does not use drugs.  Allergies:  Allergies  Allergen Reactions   Naproxen Itching   Chicken Allergy Hives, Swelling, Rash and Other (See Comments)    Sinus swelling, migraine   Aleve [Naproxen Sodium] Itching   Augmentin [Amoxicillin-Pot Clavulanate] Diarrhea   Shellfish Allergy Hives   Tomato Hives    Prior to  Admission medications   Medication Sig Start Date End Date Taking? Authorizing Provider  acetaminophen (TYLENOL) 500 MG tablet Take 500 mg by mouth every 6 (six) hours as needed.   Yes [provider]  Dexlansoprazole  (DEXILANT ) 30 MG capsule DR Take 1 capsule (30 mg total) by mouth daily before breakfast. 06/28/22  Yes Whitney Peacemaker, MD  FLUoxetine (PROZAC) 20 MG tablet Take 20 mg by mouth daily.   Yes [provider]  rosuvastatin (CRESTOR) 10 MG tablet Take 10 mg by mouth. Five times a week 06/09/19  Yes [provider]  Vitamin D, Ergocalciferol, (DRISDOL) 1.25 MG (50000 UNIT) CAPS capsule Take 50,000 Units by mouth 2 (two) times a week.   Yes [provider]  zolpidem (AMBIEN CR) 6.25 MG CR tablet Take 6.25 mg by mouth at bedtime as needed for sleep.   Yes [provider]    Blood pressure (!) 75/39, pulse 81, height 5\' 6"  (1.676 m), weight 176 lb (79.8 kg), SpO2 95%. Exam: General: Communicates without difficulty, well nourished, no acute distress. Head: Normocephalic, no evidence injury, no tenderness, facial buttresses intact without stepoff. Face/sinus: No tenderness to palpation and percussion. Facial movement is normal and symmetric. Eyes: PERRL, EOMI. No scleral icterus, conjunctivae clear. Neuro: CN II exam reveals vision grossly intact.  No nystagmus at any point of gaze. Ears: Auricles well formed without lesions.  Ear canals  are intact without mass or lesion.  No erythema or edema is appreciated.  The TMs are intact without fluid. Nose: External evaluation reveals normal support and skin without lesions.  Dorsum is intact.  Anterior rhinoscopy reveals normal mucosa over anterior aspect of inferior turbinates and intact septum.  No purulence noted. Oral:  Oral cavity and oropharynx are intact, symmetric, without erythema or edema.  Mucosa is moist without lesions. Neck: Full range of motion without pain.  There is no significant lymphadenopathy.   No masses palpable.  Thyroid  bed within normal limits to palpation.  Parotid glands and submandibular glands equal bilaterally without mass.  Trachea is midline. Neuro:  CN 2-12 grossly intact. Vestibular: No nystagmus at any point of gaze. Vestibular: Dix-Hallpike produces rotational nystagmus with right-sided positioning. Vestibular: There is no nystagmus with pneumatic pressure on either tympanic membrane or Valsalva. The cerebellar examination is unremarkable.   Procedure: Right-sided Epley maneuver  Anesthesia: None Indication: To treat the right BPPV Desciption:  The patient is first placed in a right-sided Dix-Hallpike position. After the vertigo has subsided, the head is gradually rotated from the right to the left, completing a 180 turn. The patient is then returned to the upright position. The patient tolerated the procedure well without any difficulty.    Assessment: 1.  Recurrent dizziness, likely secondary to right benign paroxysmal positional vertigo. 2.  The patient's right Dix-Hallpike maneuver is positive today. 3.  Her ear canals, tympanic membranes, and middle ear spaces are all normal.  No middle ear effusion or infection is noted. 4.  History of bilateral hearing loss.  She currently wears bilateral hearing aids.  Plan: 1.  The physical exam findings are reviewed with the patient. 2.  The pathophysiology of dizziness and BPPV are extensively discussed with the patient.  Questions are invited and answered. 3.  The right Epley maneuver is performed today without difficulty. 4.  Post Epley activity restrictions are also discussed with the patient. 5.  The patient will return for reevaluation in 1 month.  Whitney Gonzales W Whitney Gonzales 08/01/2023, 11:25 AM

## 2023-08-07 NOTE — Telephone Encounter (Signed)
 error

## 2023-09-02 ENCOUNTER — Encounter (INDEPENDENT_AMBULATORY_CARE_PROVIDER_SITE_OTHER): Payer: Self-pay | Admitting: Otolaryngology

## 2023-09-02 ENCOUNTER — Ambulatory Visit (INDEPENDENT_AMBULATORY_CARE_PROVIDER_SITE_OTHER): Admitting: Otolaryngology

## 2023-09-02 VITALS — BP 105/68 | HR 79

## 2023-09-02 DIAGNOSIS — R42 Dizziness and giddiness: Secondary | ICD-10-CM

## 2023-09-03 NOTE — Progress Notes (Signed)
 Patient ID: Whitney Gonzales, female   DOB: [birth year], 68 y.o.   MRN: [id number]  Follow-up: Recurrent dizziness  HPI: The patient is a 68 year old female who returns today for her follow-up evaluation.  She was last seen in June 2025.  At that time, she was complaining of recurrent dizziness.  Her right Dix-Hallpike maneuver was positive, consistent with right benign paroxysmal positional vertigo.  The patient was treated with a right Epley maneuver.  The patient returns today reporting only 1 episode of mild dizziness over the past month.  She has not been symptomatic for the past 3 weeks.  Currently she denies any otalgia, otorrhea, hearing difficulty, or vertigo.  Exam: General: Communicates without difficulty, well nourished, no acute distress. Head: Normocephalic, no evidence injury, no tenderness, facial buttresses intact without stepoff. Face/sinus: No tenderness to palpation and percussion. Facial movement is normal and symmetric. Eyes: PERRL, EOMI. No scleral icterus, conjunctivae clear. Neuro: CN II exam reveals vision grossly intact.  No nystagmus at any point of gaze. Ears: Auricles well formed without lesions.  Ear canals are intact without mass or lesion.  No erythema or edema is appreciated.  The TMs are intact without fluid. Nose: External evaluation reveals normal support and skin without lesions.  Dorsum is intact.  Anterior rhinoscopy reveals normal mucosa over anterior aspect of inferior turbinates and intact septum.  No purulence noted. Oral:  Oral cavity and oropharynx are intact, symmetric, without erythema or edema.  Mucosa is moist without lesions. Neck: Full range of motion without pain.  There is no significant lymphadenopathy.  No masses palpable.  Thyroid  bed within normal limits to palpation.  Parotid glands and submandibular glands equal bilaterally without mass.  Trachea is midline. Neuro:  CN 2-12 grossly intact. Vestibular: No nystagmus at any point of gaze. Dix Hallpike  negative.  Vestibular: There is no nystagmus with pneumatic pressure on either tympanic membrane or Valsalva. The cerebellar examination is unremarkable.   Assessment: 1.  The patient's recurrent dizziness is currently under control. 2.  Her Dix-Hallpike maneuver is negative today.  Her BPPV has resolved. 3.  The rest of her ENT exam is normal.  Plan: 1.  The physical exam findings are reviewed with the patient. 2.  The pathophysiology of BPPV is explained to the patient. 3.  No other ENT intervention is recommended at this time. 4.  The patient is encouraged to call with any questions or concerns.

## 2023-09-13 ENCOUNTER — Encounter: Payer: Self-pay | Admitting: Advanced Practice Midwife

## 2023-10-29 ENCOUNTER — Telehealth: Payer: Self-pay | Admitting: Internal Medicine

## 2023-10-29 NOTE — Telephone Encounter (Signed)
 Patient has found she has some constipation while on Dexilant . Whitney Gonzales stopped the Dexilant  for about 2 weeks. Bowel movements were so much better. She does have some reflux symptoms that make her feel she needs to be on some kind of medication. She asks if there is a lower dose or do you recommend a different acid reducer for her?

## 2023-10-29 NOTE — Telephone Encounter (Signed)
 PT is requesting to speak to a nurse regarding her reflux medication. Please advise.

## 2023-10-29 NOTE — Telephone Encounter (Signed)
 There is not a lower dose of Dexilant  and it is unusual to get constipation from that though I believe her history.   She can try to take the Dexilant  every other day.

## 2023-10-30 MED ORDER — RABEPRAZOLE SODIUM 20 MG PO TBEC: 20.0000 mg | DELAYED_RELEASE_TABLET | Freq: Two times a day (BID) | ORAL | 3 refills | Status: AC

## 2023-10-30 NOTE — Telephone Encounter (Signed)
 Called the patient to share the suggested plan of care. She now tells me she is concerned about the cost of Dexilant  because she purchases it from [facility]. Dexilant  is not covered by her insurance and she cannot afford to buy it in [place].  She confesses she has actually been without the Dexilant  for 6 weeks and she is seeking an alternative. She is having reflux, she is worried about damage to her esophagus if she does not treat. She is using Gaviscon. She apologizes for not being forthcoming.

## 2023-10-30 NOTE — Telephone Encounter (Signed)
 I sent in a prescription for generic Aciphex  to [facility]

## 2023-10-30 NOTE — Telephone Encounter (Signed)
 Spoke with patient and advised of plan. She is in agreement and thanks us  for the help.
